# Patient Record
Sex: Female | Born: 1985
Health system: Western US, Academic
[De-identification: ages and names within clinical notes are randomized; demographics above are authoritative.]

## PROBLEM LIST (undated history)

## (undated) DIAGNOSIS — F419 Anxiety disorder, unspecified: Secondary | ICD-10-CM

## (undated) DIAGNOSIS — F4310 Post-traumatic stress disorder, unspecified: Secondary | ICD-10-CM

## (undated) DIAGNOSIS — O009 Unspecified ectopic pregnancy without intrauterine pregnancy: Secondary | ICD-10-CM

## (undated) HISTORY — PX: APPENDECTOMY: SHX54

## (undated) HISTORY — PX: LIPOSUCTION: SHX10

---

## 2005-06-24 ENCOUNTER — Observation Stay (HOSPITAL_COMMUNITY): Admission: RE | Admit: 2005-06-24 | Discharge: 2005-06-25 | Payer: Self-pay | Admitting: Chiropractic Medicine

## 2007-04-24 IMAGING — CT CT ABDOMEN W/ CM
1 series · 15 of 32 positions shown, 19 images · IV contrast (omnipaque)
Comparison: none

CLINICAL DATA: 19-year-old with abdominal pain; question of appendicitis.
 ABDOMEN CT WITH CONTRAST:
TECHNIQUE: Multidetector CT imaging of the abdomen was performed following the standard protocol during bolus administration of intravenous contrast.  The patient returned for delayed views of the pelvis in left lateral decubitus position.
 Contrast:  125 cc Omnipaque 300 and oral contrast.
TECHNIQUE: Multidetector CT imaging of the pelvis was performed following the standard protocol during bolus administration of intravenous contrast.

[Series 2: abd_pel 5.0 b40f st · axial · 0.59mm/px · z∈[-442,-252]mm · 15 of 43 slices shown, 19 images]
[im 3/43  soft-tissue]
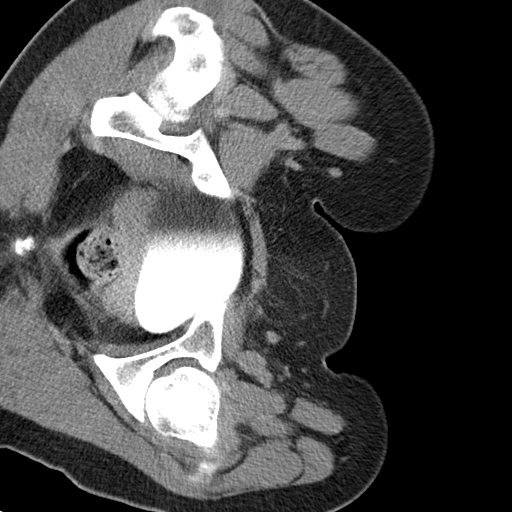
[im 3/43  bone]
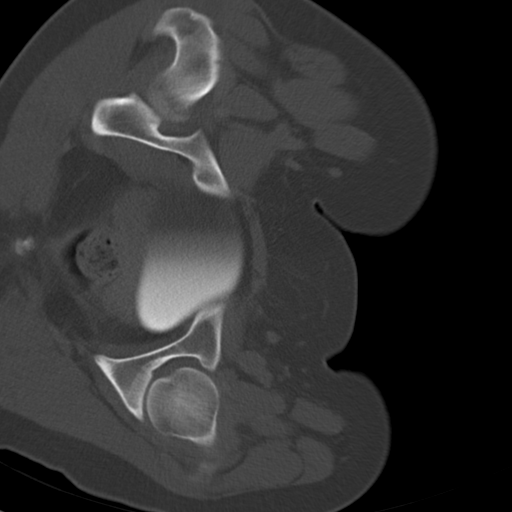
[im 6/43  soft-tissue]
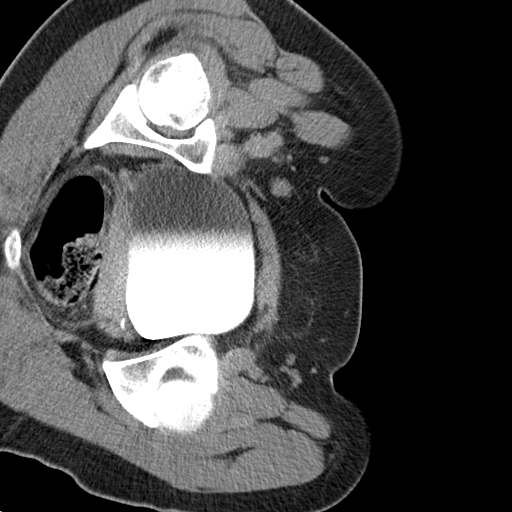
[im 9/43  soft-tissue]
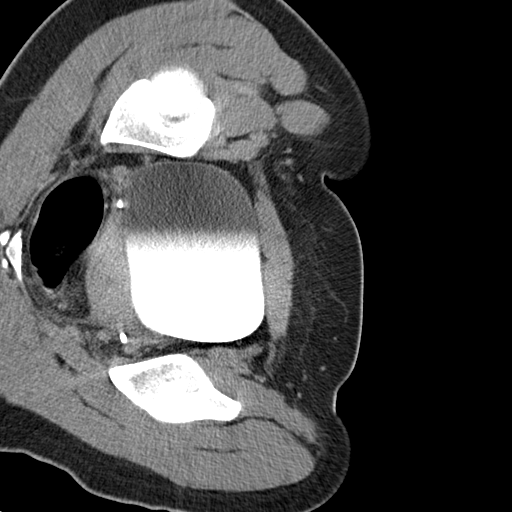
[im 13/43  soft-tissue]
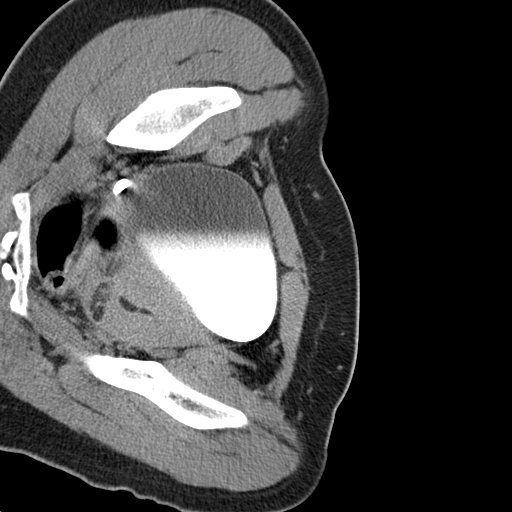
[im 15/43  soft-tissue]
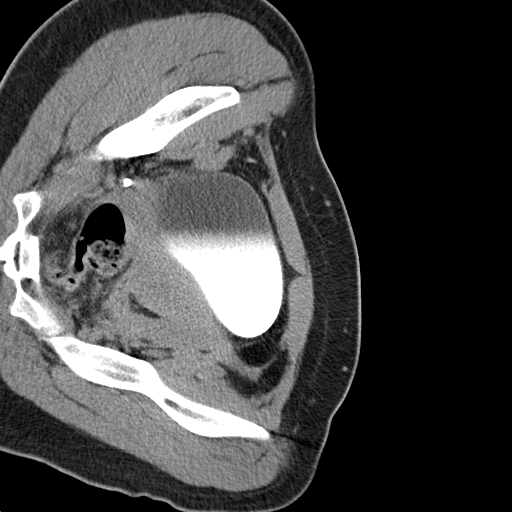
[im 18/43  soft-tissue]
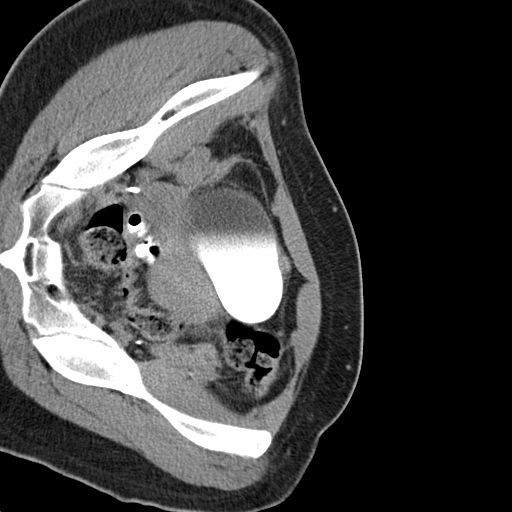
[im 22/43  soft-tissue]
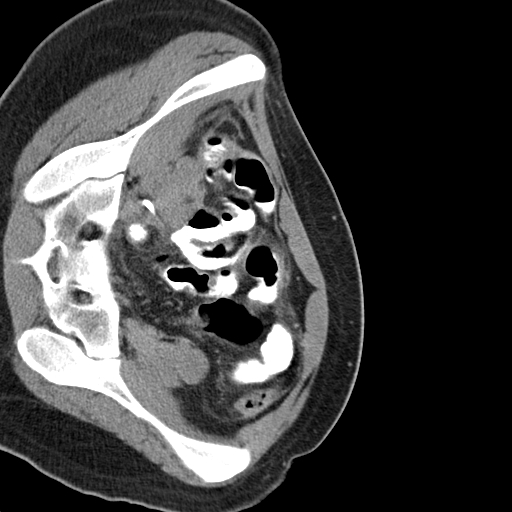
[im 25/43  soft-tissue]
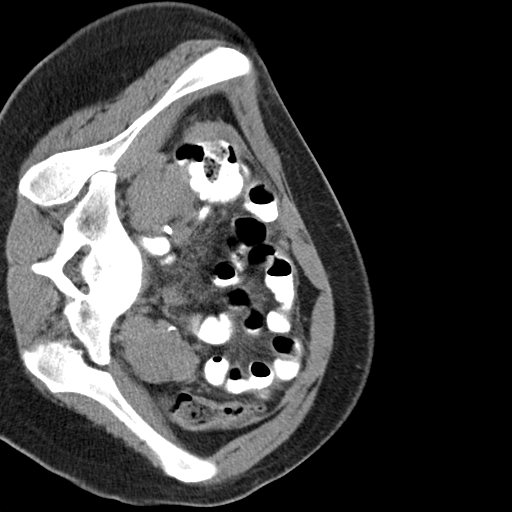
[im 28/43  soft-tissue]
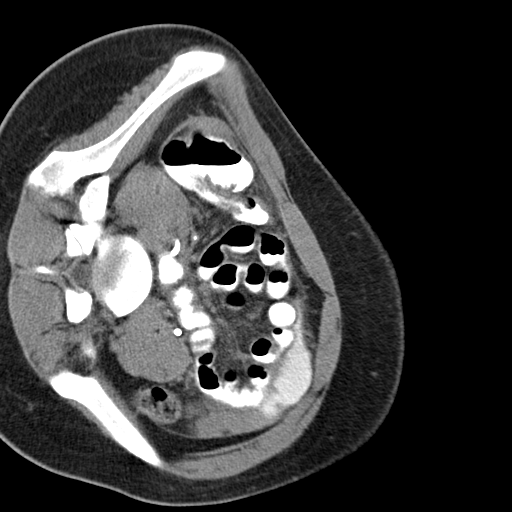
[im 28/43  bone]
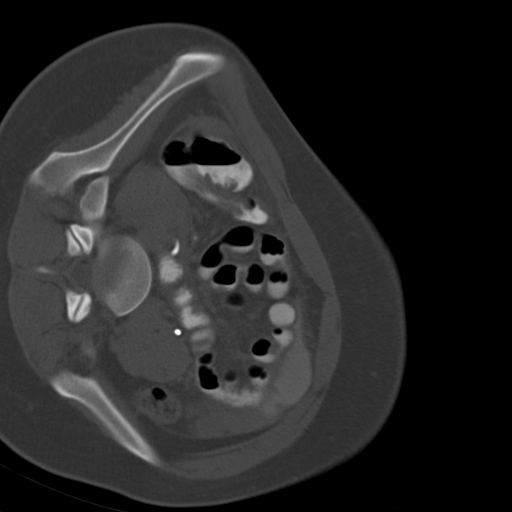
[im 30/43  soft-tissue]
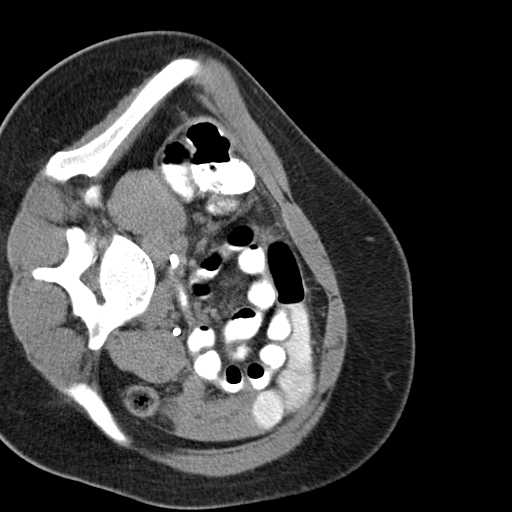
[im 34/43  soft-tissue]
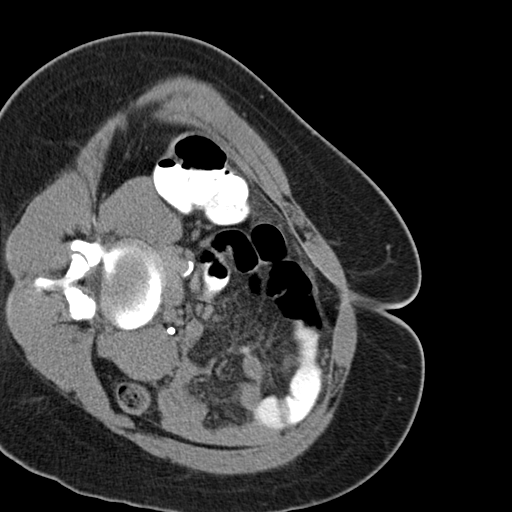
[im 37/43  soft-tissue]
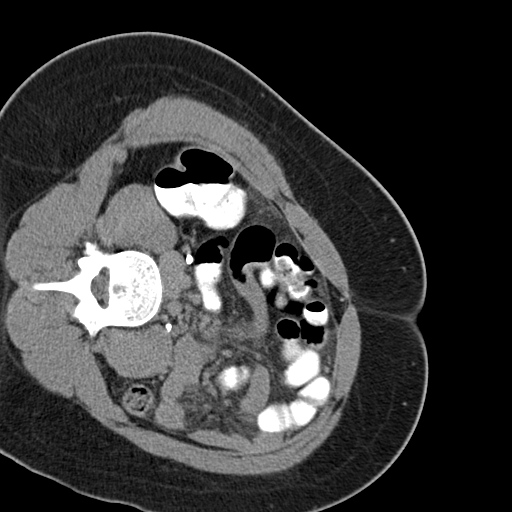
[im 37/43  lung]
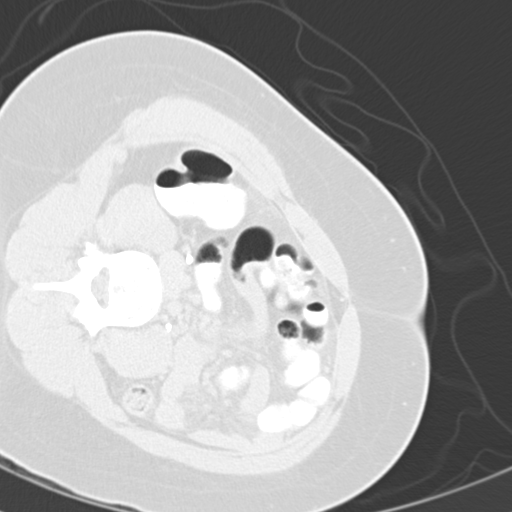
[im 38/43  lung]
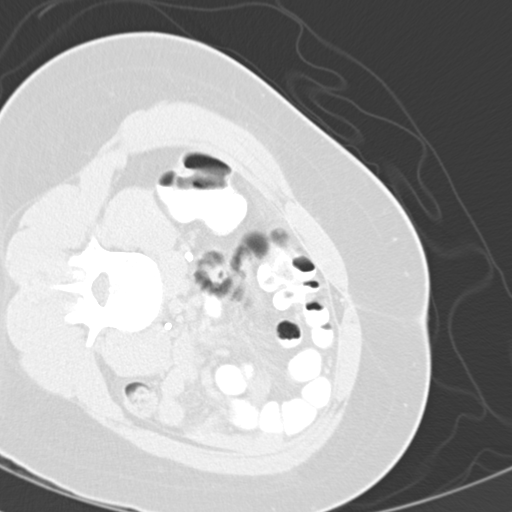
[im 40/43  soft-tissue]
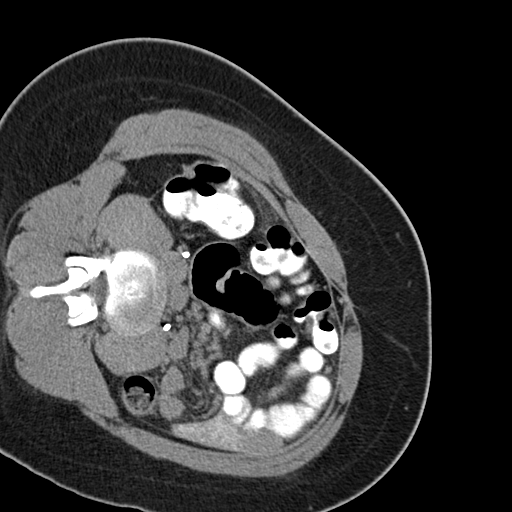
[im 40/43  lung]
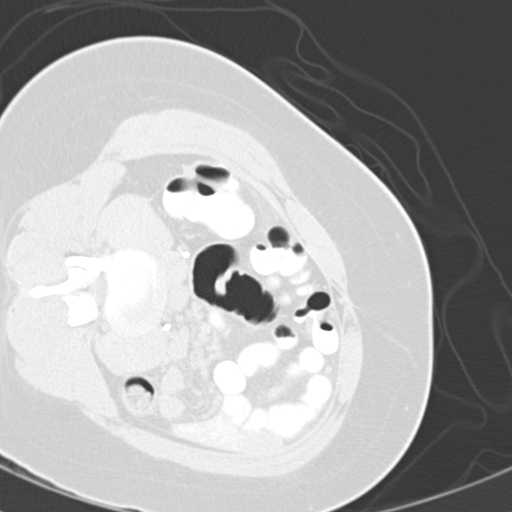
[im 41/43  lung]
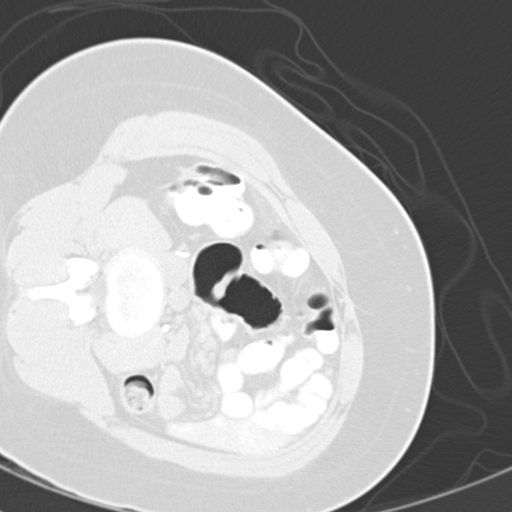

[15 of 32 positions shown; findings below may reference images not displayed]

FINDINGS: Images of the lung bases are unremarkable.  No focal abnormality is seen within the liver, spleen, pancreas, adrenal glands or kidneys.  The gallbladder is present.  There is no retroperitoneal adenopathy.  There is peritoneal thickening on the right, most notable in the right lower quadrant.  Just anterior to the cecum, there is a persistent tubular structure, which does not fill with contrast nor change in position when the patient is on her left side.  I suspect that this represents a dilated, inflamed appendix immediately adjacent to the cecum.  There may also be some secondary inflammatory change of the terminal ileum but on delayed images, this segment of bowel appears to be more smooth.  I favor the findings being related to appendicitis rather than other inflammatory process such as Crohn?s disease based on the location of the changes.
IMPRESSION: 1.  Suspect appendicitis, see above.
 2.  Peritoneal thickening, right lower quadrant.
 PELVIS CT WITH CONTRAST:
FINDINGS: The uterus is present.  There is free pelvic fluid and mild diffuse peritoneal thickening, especially in the right lower quadrant.  As described above, there is inflammatory change adjacent to the cecum, thought to represent a dilated, inflamed appendix measuring 1 cm in diameter.
IMPRESSION: 1.  Suspect appendicitis.
 2.  Free pelvic fluid and mild diffuse peritoneal enhancement.

## 2015-07-14 ENCOUNTER — Encounter (HOSPITAL_COMMUNITY): Payer: Self-pay | Admitting: Emergency Medicine

## 2015-07-14 ENCOUNTER — Emergency Department (HOSPITAL_COMMUNITY)
Admission: EM | Admit: 2015-07-14 | Discharge: 2015-07-14 | Disposition: A | Discharge status: Home / Self Care | Payer: Self-pay | Attending: Emergency Medicine | Admitting: Emergency Medicine

## 2015-07-14 DIAGNOSIS — F419 Anxiety disorder, unspecified: Secondary | ICD-10-CM | POA: Insufficient documentation

## 2015-07-14 DIAGNOSIS — R064 Hyperventilation: Secondary | ICD-10-CM

## 2015-07-14 DIAGNOSIS — F410 Panic disorder [episodic paroxysmal anxiety] without agoraphobia: Secondary | ICD-10-CM

## 2015-07-14 HISTORY — DX: Anxiety disorder, unspecified: F41.9

## 2015-07-14 MED ORDER — LORAZEPAM 2 MG/ML IJ SOLN
1.0000 mg | Freq: Once | INTRAMUSCULAR | Status: AC
  Administered 2015-07-14: 1 mg via INTRAVENOUS

## 2015-07-14 MED ORDER — LORAZEPAM 2 MG/ML IJ SOLN
INTRAMUSCULAR | Status: AC
  Filled 2015-07-14: qty 1

## 2015-07-14 MED ORDER — ALPRAZOLAM 1 MG PO TABS: 1.0000 mg | ORAL_TABLET | Freq: Three times a day (TID) | ORAL | Status: DC | PRN

## 2015-07-14 NOTE — ED Notes (Signed)
Pt resting family at bedside , pt states that she feels better

## 2015-07-14 NOTE — ED Notes (Signed)
Pt here visiting on 2300 , pt was asked to get up out of hallway and moved to waiting area where she started hyperventilating rapid response called and pt brought to ED

## 2015-07-14 NOTE — Discharge Instructions (Signed)
You have been seen today for anxiety and hyperventilation. You may not drive while under the influence of Ativan or other benzodiazepines. Follow up with PCP about this incident. Return to ED should symptoms worsen. Use the included resource guide for community support and resources.   Emergency Department Resource Guide 1) Find a Doctor and Pay Out of Pocket Although you won't have to find out who is covered by your insurance plan, it is a good idea to ask around and get recommendations. You will then need to call the office and see if the doctor you have chosen will accept you as a new patient and what types of options they offer for patients who are self-pay. Some doctors offer discounts or will set up payment plans for their patients who do not have insurance, but you will need to ask so you aren't surprised when you get to your appointment.  2) Contact Your Local Health Department Not all health departments have doctors that can see patients for sick visits, but many do, so it is worth a call to see if yours does. If you don't know where your local health department is, you can check in your phone book. The CDC also has a tool to help you locate your state's health department, and many state websites also have listings of all of their local health departments.  3) Find a Walk-in Clinic If your illness is not likely to be very severe or complicated, you may want to try a walk in clinic. These are popping up all over the country in pharmacies, drugstores, and shopping centers. They're usually staffed by nurse practitioners or physician assistants that have been trained to treat common illnesses and complaints. They're usually fairly quick and inexpensive. However, if you have serious medical issues or chronic medical problems, these are probably not your best option.  No Primary Care Doctor: - Call Health Connect at  [phone] - they can help you locate a primary care doctor that  accepts your  insurance, provides certain services, etc. - Physician Referral Service- [phone]  Chronic Pain Problems: Organization         Address  Phone   Notes  [facility]  [phone] Patients need to be referred by their primary care doctor.   Medication Assistance: Organization         Address  Phone   Notes  [facility] Medication [facility] [address] [place], [place] [id number] [phone] --Must be a resident of [facility] -- Must have NO insurance coverage whatsoever (no Medicaid/ Medicare, etc.) -- The pt. MUST have a primary care doctor that directs their care regularly and follows them in the community   MedAssist  [phone]   [employer]  [phone]    Agencies that provide inexpensive medical care: Organization         Address  Phone   Notes  [facility]  [phone]) [phone]   [facility]    [phone]) [phone]   [facility] [address] [place], [place] [address] [phone]) [phone]   [facility] [address], [place] [phone]   [facility]anned [facility]    [phone]   [facility]    [phone]   Community [facility] and [facility]  [address], [place] Phone:  [phone]) [phone], Fax:  [phone]) [phone] Hours of Operation:  9 am - 6 pm, M-F.  Also accepts Medicaid/Medicare and self-pay.  [facility] for [place], [address], [place] Phone: [phone], Fax: [phone]. Hours of Operation:  8:30 am - 5:30 pm, M-F.  Also accepts Medicaid and self-pay.  [facility] [address], [place] Phone: [phone]   [place], [place], [place] [phone], Ext. [phone] Mondays & Thursdays: 7-9 AM.  First 15 patients are seen on a first come, first serve basis.    [place] Providers:  Organization          Address  Phone   Notes  [facility] [address], [place] [phone] Also accepts self-pay patients.  [facility] [address], [place]  [phone]   [place], [address], [place] [phone]   [facility] [address], [place] [phone]   Lucianne Lei [address], [place]   [phone] Only accepts [place] Access [place] patients after they have their name applied to their card.   Self-Pay (no insurance) in [facility]:  Organization         Address  Phone   Notes  Sickle Cell Patients, [facility] [place] [phone]   [facility] Urgent Care [place] [phone]   [facility] [place]  [place], [place], [place] [phone]   [facility]/Dr. Osei-Bonsu  [address] [place] or [address], [place] [phone] Phone number for both [place] and [place] locations is the same.  Urgent Medical and [facility] [address], [place] [phone]   [facility] [address], [place] or [address] [phone]   [facility] [address] [place] [phone], phone; [phone], fax Sees patients 1st and 3rd Saturday of every month.  Must not qualify for public or private insurance (i.e. Medicaid, Medicare, [place] Health Choice, Veterans' Benefits)  Household income should be no more than 200% of the poverty level The clinic cannot treat you if you are pregnant or think you are pregnant  Sexually transmitted diseases are not treated at the clinic.    Dental Care: Organization         Address  Phone  Notes  [facility] Department of [facility] [place] [phone] Accepts children up to age 40 who are enrolled in [place] or [place]; pregnant women with a Medicaid card; and children who have applied for Medicaid or [place] Health Choice, but were declined, whose parents can pay a reduced fee at time of service.  [facility] Department of [facility]  [address], [place] [phone] Accepts children up to age 10 who are enrolled in [place] or [place]; pregnant women with a Medicaid card; and children who have applied for Medicaid or [place] Health Choice, but were declined, whose parents can pay a reduced fee at time of service.  [place] Adult Dental Access PROGRAM  [place] [phone] Patients are seen by appointment only. Walk-ins are not accepted. [place] will see patients 12 years of age and older. Monday - Tuesday (  8am-5pm) Most Wednesdays (8:30-5pm) $30 per visit, cash only  [facility] Adult Dental Access PROGRAM  [address], [facility] [phone] Patients are seen by appointment only. Walk-ins are not accepted. [place] will see patients 35 years of age and older. One Wednesday Evening (Monthly: Volunteer Based).  $30 per visit, cash only  [place]  [phone] for adults; Children under age 38, call Graduate Pediatric Dentistry at [phone]. Children aged 78-14, please call [phone] to request a pediatric application.  Dental services are provided in all areas of dental care including fillings, crowns and bridges, complete and partial dentures, implants, gum treatment, root canals, and extractions. Preventive care is also provided. Treatment is provided to both adults and children. Patients are selected via a lottery and there is often a waiting list.   [facility] [address] [place]  [phone] [email or web address]   [facility] [address] [place], [place]  [phone], Ext. [phone] Second and Fourth Thursday of each month, opens at 6:30 AM; Clinic ends at 9 AM.  Patients are seen on a first-come first-served basis, and a limited number are seen during each clinic.   [facility]  [address] Hillard Danker [place], [place] [phone]   Eligibility Requirements You must have lived in [place], [place], or [place] counties for at least the last three months.   You cannot be eligible for state or federal sponsored [employer], including [employer], [place], or [employer].   You generally cannot be eligible for healthcare insurance through your employer.    How to apply: Eligibility screenings are held every Tuesday and Wednesday afternoon from 1:00 pm until 4:00 pm. You do not need an appointment for the interview!  [facility] [address] [place], [place]   [place]  [place] Department  [place]  [phone]    Behavioral Health Resources in the Community: Intensive Outpatient Programs Organization         Address  Phone  Notes  [place]. [address] [place], [place] [phone]   [facility] [address] [place], [place]   ADS: Alcohol & Drug Svcs [address], [place], [place]   [place] [address]  [place], [place] or [phone]   Substance Abuse Resources Organization         Address  Phone  Notes  Alcohol and Drug Services  [phone]   [place]  [phone]   The [place]   [place] Pester  [phone]   Residential & Outpatient Substance Abuse Program  [phone]   Psychological Services Organization         Address  Phone  Notes  [facility] [place]  [place]  [phone]    [place] [address], [place] or [phone]    Mobile Crisis Teams Organization         Address  Phone  Notes  [facility], Mobile Crisis Care Unit  [phone]   [facility]ive Psychotherapeutic Services  [address] [place], [place]   [place] [address] [place] [phone]    Self-Help/Support Groups Organization         Address  Phone  Notes  [facility]ental Health Assoc[facility] of [place] - variety of support groups  [place] Call for more information  Narcotics Anonymous (NA), Caring Services [address], [employer] [place]  2 meetings at this location   Special educational needs teacher         Address  Phone  Notes  [facility] Residential Treatment [place],    [place]  [phone]   [facility]  [address], [place] [address]34, [place], [place]   [place], [place] [phone] Admissions: 8am-3pm M-F  Incentives Substance [place] [address],    [place], [place] [phone]   [facility] [address] [place], [place], [place]   [facility] [address]  [place], [place]   Insight Programs - Intensive Outpatient [address], [place], [place]   [facility] ([place].,  [place], [place] [phone] or [phone]   [facility]idential Treatment Services (RTS) [address] [place], [place] Accepts Medicaid  [facility] [address],  [place] [phone] Substance Abuse/Addiction Treatment   [facility] Organization         Address  Phone  Notes  [facility]  [phone]   Domenic Schwab, PhD [address] Arlis Porta [place], [place]   [phone] or [phone]   [place]  [place], [place] [phone]   [facility] [address], [place], [place] [phone] Insurance/Medicaid/sponsorship through [facility] and Families [address], [address] [place]                                    [place], [place] [phone] [place] [address], [place] [phone]    Dr. Adele Schilder  [phone]   [facility] Dept. 1) [address] [place] 2) [place] 3)  [place] [address], [place] [phone]  [phone]   [place] [phone] or [phone] (After Hours)

## 2015-07-14 NOTE — Progress Notes (Signed)
   07/14/15 1200  Clinical Encounter Type  Visited With Patient;Family;Patient and family together;Health care provider  Visit Type Initial;Behavioral Health;Social support;Psychological support;Spiritual support  Referral From Nurse  Spiritual Encounters  Spiritual Needs Emotional  Stress Factors  Patient Stress Factors Exhausted;Loss of control (anxiety)  Ch called to 2S where pt was visiting mother; when [facility] arrived, [facility] observed [facility] team working with pt having anxiety attack, on floor and breathing rapidly; [facility] consulted with RN and briefly met mother and uncle/aunt; [place] then met pt in ED [facility] and indicated to pt about mother and that family was coming to her; pt acknowledged and Hankinson then escorted uncle and aunt to pt bedside.  [place] available as needed. 12:53 PM Gwynn Burly

## 2015-07-14 NOTE — ED Provider Notes (Signed)
CSN: [id number]     Arrival date & time 07/14/15  1133 History   First MD Initiated Contact with Patient 07/14/15 1141     Chief Complaint  Patient presents with  . Hyperventilating     (Consider location/radiation/quality/duration/timing/severity/associated sxs/prior Treatment) HPI   Bianca Pitts is a 30 y.o. female, with a history of anxiety, presenting to the ED with hyperventilation. Patient was in the waiting room for 2300 [facility] while her mother was receiving open-heart surgery. Her mother got out of surgery and apparently she was not allowed to go back and see her, possibly from other family members restricting her access. Patient then started hyperventilating and crying. No falls or trauma reported. The hospital chaplain came down to see the patient here in the emergency room, told her that he just spoke with her mother and that her mother's doing fine. Also states that the patient's aunt and uncle will be down as soon as they can. [place] [facility] and is here visiting her mother. Patient adds that she is a daily smoker of marijuana.   Past Medical History  Diagnosis Date  . Anxiety    No past surgical history on file. No family history on file. Social History  Substance Use Topics  . Smoking status: Not on file  . Smokeless tobacco: Not on file  . Alcohol Use: Not on file   OB History    No data available     Review of Systems  Psychiatric/Behavioral: The patient is nervous/anxious.        Hyperventilation  All other systems reviewed and are negative.     Allergies  Review of patient's allergies indicates no known allergies.  Home Medications   Prior to Admission medications   Medication Sig Start Date End Date Taking? Authorizing Provider  ALPRAZolam Prudy Feeler(XANAX) 1 MG tablet Take 1 tablet (1 mg total) by mouth 3 (three) times daily as needed for anxiety. 07/14/15   Scarlett Portlock C Kayelyn Lemon, PA-C   BP 110/71 mmHg  Pulse 67  Temp(Src) 97.5 F (36.4  C) (Axillary)  Resp 15  SpO2 98% Physical Exam  Constitutional: She is oriented to person, place, and time. She appears well-developed and well-nourished. No distress.  HENT:  Head: Normocephalic and atraumatic.  Eyes: Conjunctivae and EOM are normal. Pupils are equal, round, and reactive to light.  Neck: Normal range of motion. Neck supple.  Cardiovascular: Normal rate, regular rhythm and normal heart sounds.   Pulmonary/Chest: Effort normal and breath sounds normal. No respiratory distress.  Abdominal: Soft. Bowel sounds are normal.  Musculoskeletal: She exhibits no edema or tenderness.  Full ROM in all extremities and spine. No paraspinal tenderness.   Lymphadenopathy:    She has no cervical adenopathy.  Neurological: She is alert and oriented to person, place, and time. She has normal reflexes.  No sensory deficits. Strength 5/5 in all extremities. No gait disturbance. Coordination intact. Cranial nerves III-XII grossly intact. No facial droop.   Skin: Skin is warm and dry. She is not diaphoretic.  Nursing note and vitals reviewed.   ED Course  Procedures (including critical care time) Labs Review Labs Reviewed - No data to display  Imaging Review No results found. I have personally reviewed and evaluated these images and lab results as part of my medical decision-making.   EKG Interpretation None      MDM   Final diagnoses:  Hyperventilation  Anxiety attack    Bianca RadonChandra Fickett presents with hyperventilation, likely due  to an anxiety reaction.  Findings and plan of care discussed with Tyrone Apple, MD.  Patient was assessed with Dr. Cyndie Chime. Patient calmed down after 1 mg of IV Ativan. Her breathing reset and she began to breathe and normal rate. Patient maintained SPO2 98%. Patient was able to carry on a conversation calmly at this point. Patient denies any falls or trauma. Denies any current pain or complaints. Patient then states "there is just a lot of family drama  going on and it makes my anxiety worse." 1:13 PM Patient was reassessed. Patient's breathing is back down to normal. Full neurologic assessment shows no deficits. Patient states that she feels ready to be discharged. Patient confirms that her presentation today is consistent with her previous panic or anxiety attacks. Patient states that although she has a prescription for Xanax she doesn't have with her. Patient was told that she would need to follow up with somebody locally if she is going to be here for any length of time. Patient was given community provider resource guide, information on anxiety and panic attacks, as well as return precautions. Patient voiced understanding of these instructions and is comfortable with discharge.  Filed Vitals:   07/14/15 1215 07/14/15 1230 07/14/15 1300 07/14/15 1330  BP: 104/74 103/76 106/67 110/71  Pulse: 60 60 71 67  Temp:      TempSrc:      Resp: 15 16 14 15   SpO2: 98% 99% 97% 98%     Anselm Pancoast, PA-C 07/14/15 1637  Leta Baptist, MD 07/17/15 1019

## 2018-11-23 ENCOUNTER — Other Ambulatory Visit: Payer: Self-pay

## 2018-11-23 ENCOUNTER — Emergency Department (HOSPITAL_COMMUNITY)
Admission: EM | Admit: 2018-11-23 | Discharge: 2018-11-24 | Disposition: A | Discharge status: Other Acute Inpatient Hospital | Payer: 59 | Attending: Emergency Medicine | Admitting: Emergency Medicine

## 2018-11-23 ENCOUNTER — Encounter (HOSPITAL_COMMUNITY): Payer: Self-pay

## 2018-11-23 DIAGNOSIS — Z1159 Encounter for screening for other viral diseases: Secondary | ICD-10-CM | POA: Diagnosis not present

## 2018-11-23 DIAGNOSIS — F410 Panic disorder [episodic paroxysmal anxiety] without agoraphobia: Secondary | ICD-10-CM | POA: Insufficient documentation

## 2018-11-23 DIAGNOSIS — F329 Major depressive disorder, single episode, unspecified: Secondary | ICD-10-CM | POA: Insufficient documentation

## 2018-11-23 DIAGNOSIS — F4310 Post-traumatic stress disorder, unspecified: Secondary | ICD-10-CM | POA: Diagnosis not present

## 2018-11-23 DIAGNOSIS — F419 Anxiety disorder, unspecified: Secondary | ICD-10-CM | POA: Insufficient documentation

## 2018-11-23 DIAGNOSIS — T424X4A Poisoning by benzodiazepines, undetermined, initial encounter: Secondary | ICD-10-CM | POA: Diagnosis present

## 2018-11-23 DIAGNOSIS — F1290 Cannabis use, unspecified, uncomplicated: Secondary | ICD-10-CM | POA: Insufficient documentation

## 2018-11-23 HISTORY — DX: Unspecified ectopic pregnancy without intrauterine pregnancy: O00.90

## 2018-11-23 HISTORY — DX: Post-traumatic stress disorder, unspecified: F43.10

## 2018-11-23 LAB — CBC WITH DIFFERENTIAL/PLATELET
Abs Immature Granulocytes: 0.05 10*3/uL (ref 0.00–0.07)
Basophils Absolute: 0 10*3/uL (ref 0.0–0.1)
Basophils Relative: 0 %
Eosinophils Absolute: 0.1 10*3/uL (ref 0.0–0.5)
Eosinophils Relative: 1 %
HCT: 43.9 % (ref 36.0–46.0)
Hemoglobin: 14.6 g/dL (ref 12.0–15.0)
Immature Granulocytes: 0 %
Lymphocytes Relative: 30 %
Lymphs Abs: 3.7 10*3/uL (ref 0.7–4.0)
MCH: 31.8 pg (ref 26.0–34.0)
MCHC: 33.3 g/dL (ref 30.0–36.0)
MCV: 95.6 fL (ref 80.0–100.0)
Monocytes Absolute: 1 10*3/uL (ref 0.1–1.0)
Monocytes Relative: 8 %
Neutro Abs: 7.5 10*3/uL (ref 1.7–7.7)
Neutrophils Relative %: 61 %
Platelets: 347 10*3/uL (ref 150–400)
RBC: 4.59 MIL/uL (ref 3.87–5.11)
RDW: 12.2 % (ref 11.5–15.5)
WBC: 12.3 10*3/uL — ABNORMAL HIGH (ref 4.0–10.5)
nRBC: 0 % (ref 0.0–0.2)

## 2018-11-23 LAB — I-STAT BETA HCG BLOOD, ED (MC, WL, AP ONLY): I-stat hCG, quantitative: <5 m[IU]/mL (ref <5)

## 2018-11-23 LAB — COMPREHENSIVE METABOLIC PANEL
ALT: 22 U/L (ref 0–44)
AST: 20 U/L (ref 15–41)
Albumin: 4.1 g/dL (ref 3.5–5.0)
Alkaline Phosphatase: 53 U/L (ref 38–126)
Anion gap: 9 (ref 5–15)
BUN: 14 mg/dL (ref 6–20)
CO2: 24 mmol/L (ref 22–32)
Calcium: 9.6 mg/dL (ref 8.9–10.3)
Chloride: 106 mmol/L (ref 98–111)
Creatinine, Ser: 0.83 mg/dL (ref 0.44–1.00)
GFR calc Af Amer: >60 mL/min (ref >60)
GFR calc non Af Amer: >60 mL/min (ref >60)
Glucose, Bld: 94 mg/dL (ref 70–99)
Potassium: 3.8 mmol/L (ref 3.5–5.1)
Sodium: 139 mmol/L (ref 135–145)
Total Bilirubin: 0.5 mg/dL (ref 0.3–1.2)
Total Protein: 7.2 g/dL (ref 6.5–8.1)

## 2018-11-23 LAB — RAPID URINE DRUG SCREEN, HOSP PERFORMED
Amphetamines: NOT DETECTED
Barbiturates: NOT DETECTED
Benzodiazepines: POSITIVE — AB
Cocaine: NOT DETECTED
Opiates: NOT DETECTED
Tetrahydrocannabinol: POSITIVE — AB

## 2018-11-23 LAB — ACETAMINOPHEN LEVEL
Acetaminophen (Tylenol), Serum: <10 ug/mL — ABNORMAL LOW (ref 10–30)
Acetaminophen (Tylenol), Serum: <10 ug/mL — ABNORMAL LOW (ref 10–30)

## 2018-11-23 LAB — ETHANOL: Alcohol, Ethyl (B): <10 mg/dL (ref <10)

## 2018-11-23 LAB — SALICYLATE LEVEL: Salicylate Lvl: <7 mg/dL (ref 2.8–30.0)

## 2018-11-23 MED ORDER — HYDROXYZINE HCL 25 MG PO TABS: 50.0000 mg | ORAL_TABLET | Freq: Once | ORAL | Status: DC

## 2018-11-23 MED ORDER — SODIUM CHLORIDE 0.9 % IV BOLUS
1000.0000 mL | Freq: Once | INTRAVENOUS | Status: AC
  Administered 2018-11-23: 22:00:00 1000 mL via INTRAVENOUS

## 2018-11-23 NOTE — ED Notes (Signed)
Pt's family contact information placed on sticker on back of pt stickers

## 2018-11-23 NOTE — ED Provider Notes (Signed)
[facility] EM[facility]GENCY [facility]MENT Provider Note   CSN: [id number] Arrival date & time: 11/23/18  1735    History   Chief Complaint Chief Complaint  Patient presents with   Panic Attack    HPI Bianca Pitts is a 33 y.o. female.     Bianca RadonChandra Mccaslin is a 33 y.o. female with a history of anxiety and panic attacks, PTSD, who presents to the emergency department for evaluation of possible benzo overdose.  Patient brought in by her aunt after she reports taking 10 mg of Xanax around noon and an additional 20 mg 30 to 40 minutes prior to arrival.  Patient reports that she took these medications because she was feeling very anxious and panicked and just wanted to be able to calm down and go to sleep.  She reports that she has been under increasing anxiety because her mom is very ill and is about to enter hospice care.  Today was her birthday and all of her extended family was gathered to celebrate with her and she flew in from out of town for this.  She reports that she is very stressed when she is around her family members, took larger doses than usual of her medication before lunch to make herself "as calm as possible" so that she could deal with her family.  But after getting into an argument with her uncle she went out to her car and took with the rest of the Xanax left in the bottle approximately 10 tablets of 2 mg Xanax.  She denies thoughts of harming herself or taking this with the intent to kill herself.  But does understand that this medication has risks when taken at high doses.  She reports a long history of anxiety and panic attacks, she usually self medicates with marijuana and Xanax.  Denies any hallucinations or HI.  No focal medical complaints today, no fevers or recent illness.  Patient's aunt expresses concern of the patient's mental health and wellbeing which is why she brought her in today.  She reports that she is concerned that the patient will try and leave more  quickly to return to family but thinks that she would benefit from continued evaluation of her mental health.  Is concerned for her safety if she were to leave the hospital today.     Past Medical History:  Diagnosis Date   Anxiety    Ectopic pregnancy    PTSD (post-traumatic stress disorder)     There are no active problems to display for this patient.   Past Surgical History:  Procedure Laterality Date   APPENDECTOMY       OB History   No obstetric history on file.      Home Medications    Prior to Admission medications   Medication Sig Start Date End Date Taking? Authorizing Provider  ALPRAZolam Prudy Feeler(XANAX) 1 MG tablet Take 1 tablet (1 mg total) by mouth 3 (three) times daily as needed for anxiety. 07/14/15   Joy, Shawn C, PA-C  amoxicillin-clavulanate (AUGMENTIN) 875-125 MG tablet Take 1 tablet by mouth 2 (two) times daily. 07/27/18   [provider]  fluconazole (DIFLUCAN) 150 MG tablet TAKE ONE TABLET BY MOUTH EVERY 3 DAYS 07/27/18   [provider]  ofloxacin (OCUFLOX) 0.3 % ophthalmic solution INSTILL 1 - 2 DROPS INTO BOTH EYES 3 TIMES A DAY FOR 7 DAYS 07/27/18   [provider]    Family History No family history on file.  Social History Social  History   Tobacco Use   Smoking status: Never Smoker  Substance Use Topics   Alcohol use: Not on file   Drug use: Yes    Types: Marijuana     Allergies   Patient has no known allergies.   Review of Systems Review of Systems  Constitutional: Negative for chills and fever.  HENT: Negative.   Eyes: Negative for visual disturbance.  Respiratory: Negative for cough and shortness of breath.   Cardiovascular: Negative for chest pain.  Gastrointestinal: Negative for abdominal pain, nausea and vomiting.  Genitourinary: Negative for dysuria.  Musculoskeletal: Negative for arthralgias and myalgias.  Skin: Negative for color change and rash.  Neurological: Negative for weakness, numbness  and headaches.  Psychiatric/Behavioral: The patient is nervous/anxious.      Physical Exam Updated Vital Signs BP (!) 136/93 (BP Location: Right Arm)    Pulse 84    Temp 98.7 F (37.1 C) (Oral)    Resp 13    SpO2 97%   Physical Exam Vitals signs and nursing note reviewed.  Constitutional:      General: She is not in acute distress.    Appearance: She is well-developed. She is not diaphoretic.     Comments: Tearful and anxious but in no acute distress  HENT:     Head: Normocephalic and atraumatic.  Eyes:     General:        Right eye: No discharge.        Left eye: No discharge.     Pupils: Pupils are equal, round, and reactive to light.  Neck:     Musculoskeletal: Neck supple.  Cardiovascular:     Rate and Rhythm: Normal rate and regular rhythm.     Heart sounds: Normal heart sounds. No murmur. No friction rub. No gallop.   Pulmonary:     Effort: Pulmonary effort is normal. No respiratory distress.     Breath sounds: Normal breath sounds. No wheezing or rales.     Comments: Respirations equal and unlabored, patient able to speak in full sentences, lungs clear to auscultation bilaterally Abdominal:     General: Bowel sounds are normal. There is no distension.     Palpations: Abdomen is soft. There is no mass.     Tenderness: There is no abdominal tenderness. There is no guarding.  Musculoskeletal:        General: No deformity.  Skin:    General: Skin is warm and dry.     Capillary Refill: Capillary refill takes less than 2 seconds.  Neurological:     Mental Status: She is alert and oriented to person, place, and time.     Coordination: Coordination normal.     Comments: Speech is clear, able to follow commands CN III-XII intact Normal strength in upper and lower extremities bilaterally including dorsiflexion and plantar flexion, strong and equal grip strength Sensation normal to light and sharp touch Moves extremities without ataxia, coordination intact   Psychiatric:         Mood and Affect: Mood is anxious. Affect is tearful.        Speech: Speech normal.        Behavior: Behavior is withdrawn. Behavior is cooperative.        Thought Content: Thought content normal. Thought content does not include homicidal or suicidal ideation.        Judgment: Judgment is impulsive.      ED Treatments / Results  Labs (all labs ordered are listed, but only abnormal  results are displayed) Labs Reviewed - No data to display  EKG None  Radiology No results found.  Procedures Procedures (including critical care time)  Medications Ordered in ED Medications  hydrOXYzine (ATARAX/VISTARIL) tablet 25 mg (has no administration in time range)  sodium chloride 0.9 % bolus 1,000 mL (0 mLs Intravenous Stopped 11/23/18 2331)     Initial Impression / Assessment and Plan / ED Course  I have reviewed the triage vital signs and the nursing notes.  Pertinent labs & imaging results that were available during my care of the patient were reviewed by me and considered in my medical decision making (see chart for details).  Patient presents after she took approximately 30 mg of Xanax over the past 5 hours for her anxiety and panic attacks.  Her mom is ill and about to enter hospice care she was gathered with her family today to celebrate her mom's birthday got into an argument with family and became increasingly anxious took initially 10 mg of Xanax and then later took an additional 20.  She denies wanting to harm herself reporting she just wanted to calm down and go to sleep in her car.  The patient's aunt expresses concern that she may have had underlying desire to harm herself and she is concerned about her mental health and wellbeing.  The patient was planning to fly back to [place] in the morning but the aunt is concerned about her safety and support system.  Wheezing control was consulted and they recommended 6-hour observation and checking Tylenol level with repeat after 4  hours, EKG and BMP.  Medical screening labs were collected and TTS consult was placed.  Patient is not requiring any oxygen or supportive care, she is alert and oriented with normal neuro exam.  Complaining of some dizziness when she is up walking around.  Labs overall reassuring, slight leukocytosis of 12.3 but no fevers or focal infectious symptoms, normal hemoglobin, no acute electrolyte derangements, normal renal and liver function, negative ethanol, acetaminophen and salicylate levels.  UDS is positive for benzodiazepines and THC.  Negative pregnancy.  Cova testing is negative.  TTS is seen and evaluated patient and they recommend inpatient treatment and have bed available here at [facility] hospital.  Patient initially does not want to be admitted, I have discussed at length with the patient's aunt who feels that the patient likely needs further behavioral health care and is concerned about her safety if she were to be discharged tonight, expresses concerns about underlying suicidal intentions.  Given these concerns I feel that it would be prudent for the patient to be admitted for inpatient psychiatric care, she remains reticent about this and asks multiple questions about what would happen if she were to sneak out or leave.  Patient is showing poor insight and concern for her safety, she was IVC for inpatient psychiatric hospitalization due to benzodiazepine overdose with concern for continued anxiety and panic attacks and concern for patient's personal safety.  4-hour Tylenol level was negative as well.  Patient is medically cleared for psychiatric hospitalization.  Patient requesting medication for anxiety, will give dose of hydroxyzine, would like to avoid additional doses of benzodiazepines given large doses earlier today to avoid further CNS depression.  Throughout ED stay patient has remained stable, not requiring any oxygen or further supportive measures, she was given IV fluids.  She is now  ambulatory with steady gait.    Final Clinical Impressions(s) / ED Diagnoses   Final diagnoses:  Benzodiazepine overdose  of undetermined intent, initial encounter  Panic attack  Anxiety    ED Discharge Orders    None       Dartha Lodge, [place] 11/24/18 6962    Alvira Monday, MD 11/26/18 0010

## 2018-11-23 NOTE — ED Notes (Addendum)
Pt took 10-15 1 mg of xanax approximately 30-45 minutes ago. Pt does not take any other medications. Poison control contacted. Concern for CNS depression, would not recommend romazicon. Pt needs BMP, 4 hour post-ingestion acetaminophen level, and EKG. Observation reccomendation of 6 hours.

## 2018-11-23 NOTE — BH Assessment (Signed)
Pt's aunt, Dr. Cardell Peach, called TTS. She says she believes Pt had "underlying intent" to kill herself by taking 10 Xanax at one time. She says Pt's family is not creating "a hostile environment." She says she is going to discuss Pt's case with Dr. Dalene Seltzer "before I make a decision." Explained that TTS is recommending inpatient psychiatric treatment.   Pamalee Leyden, [facility], [facility], [facility] Triage Specialist [phone]

## 2018-11-23 NOTE — BH Assessment (Signed)
Jodi Geralds, PA-C said Pt is being petitioned for IVC. Pt accepted to [facility], room 301-1.   Pamalee Leyden, [facility], [facility], [facility] Triage Specialist [phone]

## 2018-11-23 NOTE — ED Triage Notes (Signed)
Pt brought in by her aunt for possible ingestion. Pt states she is visiting from [place] to take care of her mom who is about to go in to hospice. Pt endorses hx of anxiety and panic attacks. Pt states she took 5 bars of xanax around noon, and 10 bars x1 hour ago. Pt denies SI/HI, states she "just wanted to calm down". Pt calm and cooperative during triage, pt tearful.
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
## 2018-11-24 ENCOUNTER — Inpatient Hospital Stay (HOSPITAL_COMMUNITY)
Admission: AD | Admit: 2018-11-24 | Discharge: 2018-11-25 | DRG: 881 | Disposition: A | Discharge status: Home / Self Care | Payer: 59 | Attending: Psychiatry | Admitting: Psychiatry

## 2018-11-24 ENCOUNTER — Encounter (HOSPITAL_COMMUNITY): Payer: Self-pay

## 2018-11-24 ENCOUNTER — Other Ambulatory Visit: Payer: Self-pay

## 2018-11-24 DIAGNOSIS — F410 Panic disorder [episodic paroxysmal anxiety] without agoraphobia: Secondary | ICD-10-CM | POA: Diagnosis present

## 2018-11-24 DIAGNOSIS — D509 Iron deficiency anemia, unspecified: Secondary | ICD-10-CM | POA: Diagnosis present

## 2018-11-24 DIAGNOSIS — F329 Major depressive disorder, single episode, unspecified: Principal | ICD-10-CM | POA: Diagnosis present

## 2018-11-24 DIAGNOSIS — F1310 Sedative, hypnotic or anxiolytic abuse, uncomplicated: Secondary | ICD-10-CM

## 2018-11-24 DIAGNOSIS — T424X4A Poisoning by benzodiazepines, undetermined, initial encounter: Secondary | ICD-10-CM | POA: Diagnosis not present

## 2018-11-24 DIAGNOSIS — F4310 Post-traumatic stress disorder, unspecified: Secondary | ICD-10-CM | POA: Diagnosis present

## 2018-11-24 DIAGNOSIS — F322 Major depressive disorder, single episode, severe without psychotic features: Secondary | ICD-10-CM | POA: Diagnosis not present

## 2018-11-24 DIAGNOSIS — T50904A Poisoning by unspecified drugs, medicaments and biological substances, undetermined, initial encounter: Secondary | ICD-10-CM | POA: Diagnosis not present

## 2018-11-24 DIAGNOSIS — T424X1A Poisoning by benzodiazepines, accidental (unintentional), initial encounter: Secondary | ICD-10-CM | POA: Diagnosis present

## 2018-11-24 LAB — SARS CORONAVIRUS 2 BY RT PCR (HOSPITAL ORDER, PERFORMED IN ~~LOC~~ HOSPITAL LAB): SARS Coronavirus 2: NEGATIVE

## 2018-11-24 MED ORDER — MAGNESIUM HYDROXIDE 400 MG/5ML PO SUSP: 30.0000 mL | Freq: Every day | ORAL | Status: DC | PRN

## 2018-11-24 MED ORDER — ADULT MULTIVITAMIN W/MINERALS CH
1.0000 | ORAL_TABLET | Freq: Every day | ORAL | Status: DC
  Administered 2018-11-24 – 2018-11-25 (×2): 1 via ORAL
  Filled 2018-11-24 (×3): qty 1

## 2018-11-24 MED ORDER — HYDROXYZINE HCL 25 MG PO TABS
25.0000 mg | ORAL_TABLET | Freq: Four times a day (QID) | ORAL | Status: DC | PRN
  Administered 2018-11-24 – 2018-11-25 (×3): 25 mg via ORAL
  Filled 2018-11-24 (×3): qty 1

## 2018-11-24 MED ORDER — VITAMIN B-1 100 MG PO TABS
100.0000 mg | ORAL_TABLET | Freq: Every day | ORAL | Status: DC
  Administered 2018-11-25: 100 mg via ORAL
  Filled 2018-11-24 (×2): qty 1

## 2018-11-24 MED ORDER — ALUM & MAG HYDROXIDE-SIMETH 200-200-20 MG/5ML PO SUSP: 30.0000 mL | ORAL | Status: DC | PRN

## 2018-11-24 MED ORDER — LOPERAMIDE HCL 2 MG PO CAPS: 2.0000 mg | ORAL_CAPSULE | ORAL | Status: DC | PRN

## 2018-11-24 MED ORDER — LORAZEPAM 1 MG PO TABS
1.0000 mg | ORAL_TABLET | Freq: Once | ORAL | Status: AC
  Administered 2018-11-24: 1 mg via ORAL
  Filled 2018-11-24: qty 1

## 2018-11-24 MED ORDER — GABAPENTIN 600 MG PO TABS: 300.0000 mg | ORAL_TABLET | Freq: Three times a day (TID) | ORAL | Status: DC

## 2018-11-24 MED ORDER — GABAPENTIN 600 MG PO TABS
300.0000 mg | ORAL_TABLET | Freq: Three times a day (TID) | ORAL | Status: DC
  Administered 2018-11-24: 300 mg via ORAL
  Filled 2018-11-24 (×2): qty 0.5

## 2018-11-24 MED ORDER — ACETAMINOPHEN 325 MG PO TABS: 650.0000 mg | ORAL_TABLET | Freq: Four times a day (QID) | ORAL | Status: DC | PRN

## 2018-11-24 MED ORDER — CITALOPRAM HYDROBROMIDE 10 MG PO TABS
10.0000 mg | ORAL_TABLET | Freq: Every day | ORAL | Status: DC
  Administered 2018-11-24 – 2018-11-25 (×2): 10 mg via ORAL
  Filled 2018-11-24 (×3): qty 1

## 2018-11-24 MED ORDER — LORAZEPAM 1 MG PO TABS
1.0000 mg | ORAL_TABLET | Freq: Four times a day (QID) | ORAL | Status: DC | PRN
  Administered 2018-11-24: 1 mg via ORAL
  Filled 2018-11-24: qty 1

## 2018-11-24 MED ORDER — HYDROXYZINE HCL 25 MG PO TABS
25.0000 mg | ORAL_TABLET | Freq: Three times a day (TID) | ORAL | Status: DC | PRN
  Administered 2018-11-24: 25 mg via ORAL
  Filled 2018-11-24: qty 1

## 2018-11-24 MED ORDER — ONDANSETRON 4 MG PO TBDP: 4.0000 mg | ORAL_TABLET | Freq: Four times a day (QID) | ORAL | Status: DC | PRN

## 2018-11-24 MED ORDER — HYDROXYZINE HCL 25 MG PO TABS
25.0000 mg | ORAL_TABLET | Freq: Once | ORAL | Status: AC
  Administered 2018-11-24: 01:00:00 25 mg via ORAL
  Filled 2018-11-24: qty 1

## 2018-11-24 MED ORDER — TRAZODONE HCL 50 MG PO TABS
50.0000 mg | ORAL_TABLET | Freq: Every evening | ORAL | Status: DC | PRN
  Administered 2018-11-24: 50 mg via ORAL
  Filled 2018-11-24: qty 1

## 2018-11-24 MED ORDER — GABAPENTIN 300 MG PO CAPS
300.0000 mg | ORAL_CAPSULE | Freq: Three times a day (TID) | ORAL | Status: DC
  Filled 2018-11-24 (×6): qty 1

## 2018-11-24 NOTE — Progress Notes (Signed)
Patient observed sitting on the floor in her room - per staff, pt fell while trying to put on scrub pants- pt reports that she "tripped" while attempting to put her leg in pants- pt reports that she became dizzy after she fell, not before. Pt reports that she didn't hit her head, but fell on he her right hip- no injuries noted- VS taken- BP 99/55- HR 74- O2 99- Pt provided with pitcher of Gatorade and encouraged to drink. Will continue to monitor -

## 2018-11-24 NOTE — Progress Notes (Signed)
[place] NOVEL CORONAVIRUS (COVID-19) DAILY CHECK-OFF SYMPTOMS - answer yes or no to each - every day NO YES  Have you had a fever in the past 24 hours?  . Fever (Temp > 37.80C / 100F) X   Have you had any of these symptoms in the past 24 hours? . New Cough .  Sore Throat  .  Shortness of Breath .  Difficulty Breathing .  Unexplained Body Aches   X   Have you had any one of these symptoms in the past 24 hours not related to allergies?   . Runny Nose .  Nasal Congestion .  Sneezing   X   If you have had runny nose, nasal congestion, sneezing in the past 24 hours, has it worsened?  X   EXPOSURES - check yes or no X   Have you traveled outside the state in the past 14 days?  X   Have you been in contact with someone with a confirmed diagnosis of COVID-19 or PUI in the past 14 days without wearing appropriate PPE?  X   Have you been living in the same home as a person with confirmed diagnosis of COVID-19 or a PUI (household contact)?    X   Have you been diagnosed with COVID-19?    X              What to do next: Answered NO to all: Answered YES to anything:   Proceed with unit schedule Follow the [facility] Inpatient Flowsheet.   

## 2018-11-24 NOTE — Progress Notes (Signed)
[place] NOVEL CORONAVIRUS (COVID-19) DAILY CHECK-OFF SYMPTOMS - answer yes or no to each - every day NO YES  Have you had a fever in the past 24 hours?  . Fever (Temp > 37.80C / 100F) X   Have you had any of these symptoms in the past 24 hours? . New Cough .  Sore Throat  .  Shortness of Breath .  Difficulty Breathing .  Unexplained Body Aches   X   Have you had any one of these symptoms in the past 24 hours not related to allergies?   . Runny Nose .  Nasal Congestion .  Sneezing   X   If you have had runny nose, nasal congestion, sneezing in the past 24 hours, has it worsened?  X   EXPOSURES - check yes or no X   Have you traveled outside the state in the past 14 days?  X   Have you been in contact with someone with a confirmed diagnosis of COVID-19 or PUI in the past 14 days without wearing appropriate PPE?  X   Have you been living in the same home as a person with confirmed diagnosis of COVID-19 or a PUI (household contact)?    X   Have you been diagnosed with COVID-19?    X              What to do next: Answered NO to all: Answered YES to anything:   Proceed with unit schedule Follow the [facility] Inpatient Flowsheet.   

## 2018-11-24 NOTE — Tx Team (Addendum)
Initial Treatment Plan 11/24/2018 3:52 AM Bianca Pitts GHW:[id number]    PATIENT STRESSORS: Loss of pt reports mother will be in hospice next week Substance abuse   PATIENT STRENGTHS: Ability for insight Active sense of humor Average or above average intelligence Capable of independent living Work skills   PATIENT IDENTIFIED PROBLEMS: Anxiety   Xanax abuse  Panic attacks      " Ways to manage anxiety and not project my feelings on to others. "           DISCHARGE CRITERIA:  Improved stabilization in mood, thinking, and/or behavior  PRELIMINARY DISCHARGE PLAN: Return to previous living arrangement  PATIENT/FAMILY INVOLVEMENT: This treatment plan has been presented to and reviewed with the patient, Bianca Pitts, and/or family member.  The patient and family have been given the opportunity to ask questions and make suggestions.  Floyce Stakes, RN 11/24/2018, 3:52 AM

## 2018-11-24 NOTE — ED Notes (Signed)
Patient had anxiety attack at EMS bridge prior to exiting ED; pt was unable to calm down and was taken back to [facility] to [facility] down; pt states she wants to leave and to talk to her Aunt who told her she could get IVC reversed; RN and sitter was able to calm patient down enough to be transported to [facility]; EDP was able to lay eyes on patient to endure patient was medically ok to be transported-Monique,RN
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
## 2018-11-24 NOTE — Progress Notes (Addendum)
Pt calmer at this time- pt states, "I am prone to panic attacks"-  reports that she needs to get home [facility]) because she is having to pay on her rental car and for her TEFL teacher. Pt currently denies SI/HI and A/V hallucinations. Pt denies that she tried to commit suicide- had taken "[place] xanax" which patient claims is "much weaker". Patient resting in bed at this time Pt remains safe with 15 min checks
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
## 2018-11-24 NOTE — [facility] Group Notes (Signed)
[facility] LCSW Group Therapy Note  11/24/2018  10:00-11:00AM  Type of Therapy and Topic:  Group Therapy:  Adding Supports Including Yourself  Participation Level:  Did Not Attend   Description of Group:  Patients in this group were introduced to the concept that additional supports including self-support are an essential part of recovery.  Patients listed what supports they believe they need to add to their lives to achieve their goals at discharge, and they listed such things as therapist, family, doctor, support groups, and service dog.  CSW described the  continuum of mental health/substance abuse services available and the group discussed the differences among these including support group, therapy group, 12-step group, Assertive Community Treatment Team, Peer Support Specialist, and such.  A song entitled "My Own Hero" was played and a group discussion ensued in which patients stated they could relate to the song and it inspired them to realize they have be willing to help themselves in order to succeed, because other people cannot achieve sobriety or stability for them.  A song was played called "I Am Enough" which led to a discussion about being willing to believe we are worth the effort of being a self-support.  Group members expressed appreciation for each other.  Therapeutic Goals: 1)  demonstrate the importance of being a key part of one's own support system 2)  discuss various available supports 3)  encourage patient to use music as part of their self-support and focus on goals 4)  elicit ideas from patients about supports that need to be added   Summary of Patient Progress:  N/A   Therapeutic Modalities:   Motivational Interviewing Activity  Conroy Goracke J Grossman-Orr       
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
## 2018-11-24 NOTE — [facility] Suicide Risk Assessment (Addendum)
[facility] Admission Suicide Risk Assessment   Nursing information obtained from:  Patient Demographic factors:  Living alone Current Mental Status:  NA Loss Factors:  Loss of significant relationship(pts mom to hospice this week) Historical Factors:  NA Risk Reduction Factors:  Employed  Total Time spent with patient: 45 minutes Principal Problem:  Panic Disorder, BZD Abuse, S/P BZD Overdose  Diagnosis:  Active Problems:   MDD (major depressive disorder), severe (HCC)  Subjective Data:   Continued Clinical Symptoms:  Alcohol Use Disorder Identification Test Final Score (AUDIT): 0 The "Alcohol Use Disorders Identification Test", Guidelines for Use in Primary Care, Second Edition.  World Science writer [facility] [place]). Score between 0-7:  no or low risk or alcohol related problems. Score between 8-15:  moderate risk of alcohol related problems. Score between 16-19:  high risk of alcohol related problems. Score 20 or above:  warrants further diagnostic evaluation for alcohol dependence and treatment.   CLINICAL FACTORS:  33 year old female, lives in [place], reports she is in the [place] area visiting her mother for her 60th birthday.  Reports mother is terminally ill related to chronic cardiac illness.  Patient reports history of anxiety and describes long history of panic attacks and some agoraphobia.  She also endorses history of PTSD symptoms.  She reports she has been using Xanax intermittently for anxiety for several years (which she prefers on the street/not prescribed).  States that she generally does not use Xanax daily  but that use has become more frequent lately due to her anxiety related to her mother.  On day of admission she overdosed on 15 tablets of Xanax, states this was not suicidal in intent but rather related to increased stress and family tension.    Psychiatric Specialty Exam: Physical Exam  ROS  Blood pressure 112/75, pulse 96, temperature 99.4 F (37.4 C),  temperature source Oral, resp. rate 18, height 5\' 4"  (1.626 m), weight 77.1 kg.Body mass index is 29.18 kg/m.   see admit note MSE    COGNITIVE FEATURES THAT CONTRIBUTE TO RISK:  Closed-mindedness and Loss of executive function    SUICIDE RISK:   Moderate:  Frequent suicidal ideation with limited intensity, and duration, some specificity in terms of plans, no associated intent, good self-control, limited dysphoria/symptomatology, some risk factors present, and identifiable protective factors, including available and accessible social support.  PLAN OF CARE: Patient will be admitted to inpatient psychiatric unit for stabilization and safety. Will provide and encourage milieu participation. Provide medication management and maked adjustments as needed.Will also provide medication management to address possible BZD withdrawal. Will follow daily.    I certify that inpatient services furnished can reasonably be expected to improve the patient's condition.   Craige Cotta, MD 11/24/2018, 11:45 AM

## 2018-11-24 NOTE — Progress Notes (Signed)
D. Pt visibly upset after speaking on the phone this morning- pt tore up her paper with phone numbers on it- and stomped off to her room- pt then observed sitting on the floor, crying and screaming (while muffling her screams with her arm). Pt was not consolable for this nurse- MD and NP made aware. A. Pt medicated with Ativan and Gabapentin per MD order. Pt supported emotionally and encouraged to express concerns and ask questions.   R. Pt remains safe with 15 minute checks. Will continue POC.

## 2018-11-24 NOTE — Progress Notes (Signed)
Pt said she will lie and say her over all day was a 10. The one positive thing that happen to her today medication given.

## 2018-11-24 NOTE — ED Notes (Signed)
Patient's belongings inventoried and placed in locker 6; Patent's cell phone remain at RN station due to impending transfer to [facility]

## 2018-11-24 NOTE — ED Notes (Signed)
Patient in room stating she can breath but has her hands covering her mouth; Pt is hyperventilating but remains stable-Monique,RN

## 2018-11-24 NOTE — ED Notes (Addendum)
IVC papers has been served and faxed to [facility], orignal in red folder and copy placed in medical records; patient will be transported to [facility] via [facility] at this time-Monique,RN

## 2018-11-24 NOTE — Progress Notes (Signed)
Patient admitted involuntarily after she and family members had a disagreement about her xanax use. Patient reports that she lives in [place] and came here for her mothers 60th birthday party. Her mother is dying. She reports that she takes xanax and since she was unable to smoke THC she bought some xanax off the street and took 5. She felt they were not strong enough and took more. Her family was afraid she was not going to wake up and had her come to [facility]. Her aunt had her involuntarily committed because she felt this was a suicide attempt. Patient is upset because she has a flight back to [place] in the am and has a rental car to return. She reports that she will need to get back home as soon as possible. She was oriented to unit, smacks given and safety maintained on unit with 15 min checks.

## 2018-11-25 DIAGNOSIS — F322 Major depressive disorder, single episode, severe without psychotic features: Secondary | ICD-10-CM

## 2018-11-25 LAB — TSH: TSH: 1.45 u[IU]/mL (ref 0.350–4.500)

## 2018-11-25 NOTE — Plan of Care (Signed)
  Problem: Education: Goal: Ability to state activities that reduce stress will improve Outcome: Adequate for Discharge   Problem: Coping: Goal: Ability to identify and develop effective coping behavior will improve Outcome: Adequate for Discharge   Problem: Self-Concept: Goal: Ability to identify factors that promote anxiety will improve Outcome: Adequate for Discharge Goal: Level of anxiety will decrease Outcome: Adequate for Discharge Goal: Ability to modify response to factors that promote anxiety will improve Outcome: Adequate for Discharge   Problem: Education: Goal: Knowledge of [place] Education information/materials will improve Outcome: Adequate for Discharge Goal: Emotional status will improve Outcome: Adequate for Discharge Goal: Mental status will improve Outcome: Adequate for Discharge Goal: Verbalization of understanding the information provided will improve Outcome: Adequate for Discharge   Problem: Activity: Goal: Interest or engagement in activities will improve Outcome: Adequate for Discharge Goal: Sleeping patterns will improve Outcome: Adequate for Discharge   Problem: Coping: Goal: Ability to verbalize frustrations and anger appropriately will improve Outcome: Adequate for Discharge Goal: Ability to demonstrate self-control will improve Outcome: Adequate for Discharge   Problem: Health Behavior/Discharge Planning: Goal: Identification of resources available to assist in meeting health care needs will improve Outcome: Adequate for Discharge Goal: Compliance with treatment plan for underlying cause of condition will improve Outcome: Adequate for Discharge   Problem: Physical Regulation: Goal: Ability to maintain clinical measurements within normal limits will improve Outcome: Adequate for Discharge   Problem: Safety: Goal: Periods of time without injury will increase Outcome: Adequate for Discharge

## 2018-11-25 NOTE — Discharge Summary (Signed)
Physician Discharge Summary Note  Patient:  Bianca Pitts is an 33 y.o., female MRN:  [id number] DOB:  08-15-1985 Patient phone:  [phone] (home)  Patient address:   [address] [place] [id number],  Total Time spent with patient: 45 minutes  Date of Admission:  11/24/2018 Date of Discharge: 11/25/2018  Reason for Admission:   Diagnosis:  S/P Alprazolam Overdose, history of Panic Disorder  History of Present Illness: 33 year old single female. Patient presented to ED yesterday, brought by family members, following Xanax overdose. Reports she had taken 5 tablets of Xanax ( 1 mgr) initially, in the context of increased anxiety and family tension ( argument with an uncle), and later took another 10 tablets. She denies suicidal intention and states " I was just trying to relax and be able to return to my mother's birthday".  Patient explains she lives in [place], but has been in [place] this last week with the purpose of visiting her mother who is terminally ill from a cardiac condition and whose 60th birthday was yesterday. States that she has a history of anxiety and panic attacks and that these have worsened recently due to her concerns about her mother. She reports she has been purchasing Xanax ( not prescribed ) for several years, and usually takes irregularly for panic attacks but that more recently she has been taking more frequently and in increased amounts , in the context of increased stress. She does not endorse significant depression or significant neuro-vegetative symptoms of depression. She denies any recent suicidal ideations.  Principal Problem: <principal problem not specified> Discharge Diagnoses: Active Problems:   MDD (major depressive disorder), severe (HCC)  Past Medical History:  Past Medical History:  Diagnosis Date  . Anxiety   . Ectopic pregnancy   . PTSD (post-traumatic stress disorder)     Past Surgical History:  Procedure Laterality  Date  . APPENDECTOMY    . LIPOSUCTION     Family History: History reviewed. No pertinent family history. Family Psychiatric  History: uncertain Social History:  Social History   Substance and Sexual Activity  Alcohol Use Not Currently     Social History   Substance and Sexual Activity  Drug Use Yes  . Frequency: 7.0 times per week  . Types: Marijuana    Social History   Socioeconomic History  . Marital status: Single    Spouse name: Not on file  . Number of children: Not on file  . Years of education: Not on file  . Highest education level: Some college, no degree  Occupational History  . Not on file  Social Needs  . Financial resource strain: Patient refused  . Food insecurity:    Worry: Patient refused    Inability: Patient refused  . Transportation needs:    Medical: Patient refused    Non-medical: Patient refused  Tobacco Use  . Smoking status: Never Smoker  . Smokeless tobacco: Never Used  Substance and Sexual Activity  . Alcohol use: Not Currently  . Drug use: Yes    Frequency: 7.0 times per week    Types: Marijuana  . Sexual activity: Yes  Lifestyle  . Physical activity:    Days per week: Patient refused    Minutes per session: Patient refused  . Stress: Not on file  Relationships  . Social connections:    Talks on phone: Patient refused    Gets together: Patient refused    Attends religious service: Patient refused    Active  member of club or organization: Patient refused    Attends meetings of clubs or organizations: Patient refused    Relationship status: Patient refused  Other Topics Concern  . Not on file  Social History Narrative  . Not on file    Hospital Course:    Patient always insisted that she was not suicidal that her overdose was not intentional she just took an excessive amount of Xanax, knowing the stress of her family matters she packed her belongings with her more than usual amount of Xanax it is noted that she buys it off  the street she does not have a prescription and she is a cannabis dependent patient, but the bottom line is she is not suicidal she displayed no danger behaviors by Monday morning it was clear she could be discharged but no amount of motivational interviewing would allow her to see that cannabis and Xanax abuse off the street is not in her best interest and she refuses detox and rehab for these measures.  She is discharged home, I strongly recommend she stop these illicit compounds but she refuses.  But not suicidal or psychotic stable to go  Physical Findings: AIMS: Facial and Oral Movements Muscles of Facial Expression: None, normal Lips and Perioral Area: None, normal Jaw: None, normal Tongue: None, normal,Extremity Movements Upper (arms, wrists, hands, fingers): None, normal Lower (legs, knees, ankles, toes): None, normal, Trunk Movements Neck, shoulders, hips: None, normal, Overall Severity Severity of abnormal movements (highest score from questions above): None, normal Incapacitation due to abnormal movements: None, normal Patient's awareness of abnormal movements (rate only patient's report): No Awareness, Dental Status Current problems with teeth and/or dentures?: No Does patient usually wear dentures?: No  CIWA:  CIWA-Ar Total: 4 COWS:    Musculoskeletal: Strength & Muscle Tone: within normal limits Gait & Station: normal Patient leans: N/A  Psychiatric Specialty Exam: ROS  Blood pressure 106/75, pulse 72, temperature 98.1 F (36.7 C), resp. rate 20, height  (1.626 m), weight 77.1 kg.Body mass index is 29.18 kg/m.  General Appearance: Casual  Eye Contact::  Fair  Speech:  Clear and Coherent409  Volume:  Decreased  Mood:  Dysphoric  Affect:  Constricted and Flat  Thought Process:  Coherent  Orientation:  Full (Time, Place, and Person)  Thought Content:  Logical and Tangential  Suicidal Thoughts:  No  Homicidal Thoughts:  No  Memory:  Immediate;   Fair   Judgement:  Fair  Insight:  Shallow  Psychomotor Activity:  Normal  Concentration:  Good  Recall:  Good  Fund of Knowledge:Good  Language: Fair  Akathisia:  Negative  Handed:  Right  AIMS (if indicated):     Assets:  Communication Skills Desire for Improvement  Sleep:  Number of Hours: 5.75  Cognition: WNL  ADL's:  Intact       Have you used any form of tobacco in the last 30 days? (Cigarettes, Smokeless Tobacco, Cigars, and/or Pipes): No  Has this patient used any form of tobacco in the last 30 days? (Cigarettes, Smokeless Tobacco, Cigars, and/or Pipes) Yes, No  Blood Alcohol level:  Lab Results  Component Value Date   ETH <10 11/23/2018    Metabolic Disorder Labs:  No results found for: HGBA1C, MPG No results found for: PROLACTIN No results found for: CHOL, TRIG, HDL, CHOLHDL, VLDL, LDLCALC  See Psychiatric Specialty Exam and Suicide Risk Assessment completed by Attending Physician prior to discharge.  Discharge destination:  Home  Is patient on multiple antipsychotic  therapies at discharge:  No   Has Patient had three or more failed trials of antipsychotic monotherapy by history:  No  Recommended Plan for Multiple Antipsychotic Therapies: NA   Allergies as of 11/25/2018      Reactions   Other Hives, Nausea And Vomiting, Other (See Comments)   Alcohol- Projectile vomits, passes out, develops a fever, and turns red from head-to-toes (also)   Tape Rash, Other (See Comments)   ONLY PAPER TAPE IS TOLERATED      Medication List    STOP taking these medications   acetaminophen 500 MG tablet Commonly known as:  TYLENOL   ALPRAZolam 1 MG tablet Commonly known as:  XANAX     TAKE these medications     Indication  b complex vitamins tablet Take 1 tablet by mouth daily.  Indication:  Vitamin Deficiency   BEE POLLEN PO Take 1 capsule by mouth daily.  Indication:  21-Hydroxylase Deficiency   COLLAGEN PO Take 1 capsule by mouth daily.  Indication:   21-Hydroxylase Deficiency   Ferrous Sulfate 220 (44 Fe) MG/5ML Liqd Take 220 mg by mouth See admin instructions. Swallow 220 mg (5 ml's) by mouth 1-2 times a day  Indication:  Anemia From Inadequate Iron in the Body   folic acid 800 MCG tablet Commonly known as:  FOLVITE Take 800 mcg by mouth daily.  Indication:  Anemia From Inadequate Folic Acid   One-A-Day Womens Formula Tabs Take 1 tablet by mouth daily.  Indication:  21-Hydroxylase Deficiency   TURMERIC PO Take 1 capsule by mouth daily.  Indication:  21-Hydroxylase Deficiency   VITAMIN C PO Take 1 tablet by mouth See admin instructions. 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           Follow-up recommendations:  Activity:  full SignedMalvin Johns, MD 11/25/2018, 9:26 AM

## 2018-11-25 NOTE — [facility] Suicide Risk Assessment (Addendum)
[facility] INPATIENT:  Family/Significant Other Suicide Prevention Education  Suicide Prevention Education:  Contact Attempts: Pt's father, Bianca Pitts [phone] been identified by the patient as the family member/significant other with whom the patient will be residing, and identified as the person(s) who will aid the patient in the event of a mental health crisis.  With written consent from the patient, two attempts were made to provide suicide prevention education, prior to and/or following the patient's discharge.  We were unsuccessful in providing suicide prevention education.  A suicide education pamphlet was given to the patient to share with family/significant other.  Date and time of first attempt:11/25/2018 @ 10:15am Date and time of second attempt: 11/25/2018 @ 10:45am    CSW contacted number which ended in a dial tone on both times with no ability to leave a VM.   Delphia Grates 11/25/2018, 10:27 AM

## 2018-11-25 NOTE — Progress Notes (Signed)
Recreation Therapy Notes  Date:  1.22.20 Time: 0930 Location: 300 Hall Dayroom  Group Topic: Stress Management  Goal Area(s) Addresses:  Patient will identify positive stress management techniques. Patient will identify benefits of using stress management post d/c.  Intervention: Stress Management  Activity :  Meditation.  LRT introduced the stress management technique of meditation.  LRT played Pitts meditation that focused on making the most of your day.  Patients were to listen and follow as meditation played to engage in activity.  Education:  Stress Management, Discharge Planning.   Education Outcome: Acknowledges Education  Clinical Observations/Feedback: Pt did not attend group.     Asahd Can, LRT/CTRS         Bianca Pitts 11/25/2018 11:49 AM 

## 2018-11-25 NOTE — Progress Notes (Signed)
  [facility] Adult Case Management Discharge Plan :  Will you be returning to the same living situation after discharge:  Yes,  home in [place]  At discharge, do you have transportation home?: Yes,  uber to the airport to fly back to [place], [place] Do you have the ability to pay for your medications: Yes,  private insurance  Release of information consent forms completed and in the chart;  Patient's signature needed at discharge.  Patient to Follow up at: Follow-up Information    [facility] Follow up.   Why:  Please follow up with clinic within 3 days of discharge to schedule a hospital discharge appointment. Be sure to bring your photo ID, proof of insurance, current medications, and any discharge paperwork from this hospitalization.  Contact information: [address] [place] [address] Ph: [phone] Fx: [phone]          Next level of care provider has access to [facility] Community Link:no  Safety Planning and Suicide Prevention discussed: No.; Two attempts were made for pt's father but unable to get ahold of pt's father.   Have you used any form of tobacco in the last 30 days? (Cigarettes, Smokeless Tobacco, Cigars, and/or Pipes): No  Has patient been referred to the Quitline?: Patient refused referral  Patient has been referred for addiction treatment: Yes  Delphia Grates, LCSW 11/25/2018, 10:47 AM

## 2018-11-25 NOTE — [facility] Suicide Risk Assessment (Signed)
[facility] Discharge Suicide Risk Assessment   Principal Problem: Overdose on Xanax/cannabis dependence/denies suicide attempt Discharge Diagnoses: Active Problems:   MDD (major depressive disorder), severe (HCC)   Total Time spent with patient: 45 minutes  Musculoskeletal: Strength & Muscle Tone: within normal limits Gait & Station: normal Patient leans: N/A  Psychiatric Specialty Exam: ROS  Blood pressure 106/75, pulse 72, temperature 98.1 F (36.7 C), resp. rate 20, height 5\' 4"  (1.626 m), weight 77.1 kg.Body mass index is 29.18 kg/m.  General Appearance: Casual  Eye Contact::  Fair  Speech:  Clear and Coherent409  Volume:  Decreased  Mood:  Dysphoric  Affect:  Constricted and Flat  Thought Process:  Coherent  Orientation:  Full (Time, Place, and Person)  Thought Content:  Logical and Tangential  Suicidal Thoughts:  No  Homicidal Thoughts:  No  Memory:  Immediate;   Fair  Judgement:  Fair  Insight:  Shallow  Psychomotor Activity:  Normal  Concentration:  Good  Recall:  Good  Fund of Knowledge:Good  Language: Fair  Akathisia:  Negative  Handed:  Right  AIMS (if indicated):     Assets:  Communication Skills Desire for Improvement  Sleep:  Number of Hours: 5.75  Cognition: WNL  ADL's:  Intact   Mental Status Per Nursing Assessment::   On Admission:  NA  Demographic Factors:  NA  Loss Factors: NA  Historical Factors: Anniversary of important loss  Risk Reduction Factors:   Sense of responsibility to family and Religious beliefs about death  Continued Clinical Symptoms:  Alcohol/Substance Abuse/Dependencies  Cognitive Features That Contribute To Risk:  None    Suicide Risk:  Minimal: No identifiable suicidal ideation.  Patients presenting with no risk factors but with morbid ruminations; may be classified as minimal risk based on the severity of the depressive symptoms  Follow-up Information    Resources needed in [place] Follow up.          As  previous admission patient's greatest risk is a non-intentional overdose which she is not suicidal her story is consistent she denies wanting to harm self or others, unfortunately she does not want to give up cannabis dependency and Xanax abuse and I cannot convince her towards detox or rehab measures despite motivational interviewing  Plan Of Care/Follow-up recommendations:  Activity:  full  Jalene Demo, MD 11/25/2018, 7:42 AM

## 2018-11-25 NOTE — Progress Notes (Signed)
Discharge note: Patient reviewed discharge paperwork with RN including prescriptions, follow up appointments, and lab work. Patient given the opportunity to ask questions. All concerns were addressed. All belongings were returned to patient. Denied SI/HI/AVH. Patient thanked staff for their care while at the hospital.  Patient was discharged to lobby where she was going to order an [place].

## 2018-11-28 ENCOUNTER — Ambulatory Visit: Payer: PRIVATE HEALTH INSURANCE

## 2019-01-06 ENCOUNTER — Telehealth: Payer: PRIVATE HEALTH INSURANCE

## 2019-01-06 DIAGNOSIS — Z7184 Encounter for health counseling related to travel: Secondary | ICD-10-CM

## 2019-01-06 DIAGNOSIS — U071 COVID-19 virus detected: Secondary | ICD-10-CM

## 2019-01-06 NOTE — Progress Notes
CC:  No chief complaint on file.        SUBJECTIVE:      Patient Consent to Telehealth Questionnaire   [facility] TELEHEALTH PRECHECKIN QUESTIONS 01/06/2019   By clicking ''I Agree'', I consent to the below:  I Agree     - I agree  to be treated via a video visit and acknowledge that I may be liable for any relevant copays or coinsurance depending on my insurance plan.  - I understand that this video visit is offered for my convenience and I am able to cancel and reschedule for an in-person appointment if I desire.  - I also acknowledge that sensitive medical information may be discussed during this video visit appointment and that it is my responsibility to locate myself in a location that ensures privacy to my own level of comfort.  - I also acknowledge that I should not be participating in a video visit in a way that could cause danger to myself or to those around me (such as driving or walking).  If my provider is concerned about my safety, I understand that they have the right to terminate the visit.     She tested positive for COVID19.  Patient had a Tm 100.1 last "Sunday, and then it self resolved.  On 6/24 tested, and then positive result on 6/25.  Currently, no fever, no coughing.  But she cannot smell anything.  No runny nose, no congestion, no abd pain, and no diarrhea.  And no loss of taste.  3 weeks ago she traveled to [place], but no one she travelled with had COVID.  She attended a couple of social gatherings a week prior and no one was wearing a mask.  No one she was with has tested positive for COVID.      Liposuction revision is scheduled in [place] on 01/20/2019.      Patient is participating in the Mood Therapy and has a psychiatrist assigned to her, and next month is next appointment but recommended a psychiatrist on a regular basis  Her mother has been ill and she was the one resuscitating her and then in a coma for 2 weeks.  This started the battle of depression.  More recently, they have been preparing her for her actual death.  She had to say ''bye'' several times but she has been still living and not choosing hospice.  Patient also used more Xanax than normal, and she was admitted under psychiatric care as a suicide attempt.  She hasn't started Lexapro or Gabapentin.      14" -point ROS was performed.  All pertinent positives and negatives are included in the HPI.      MEDICATIONS:  Current Outpatient Medications   Medication Sig   ??? Ascorbic Acid (VITAMIN C) 100 MG tablet Take 1 tablet by mouth.   ??? b complex vitamins tablet Take 1 tablet by mouth.   ??? BEE POLLEN PO Take 1 capsule by mouth.   ??? ferrous sulfate 220 (44 Fe) mg/5 mL LIQD liquid Take 220 mg by mouth.   ??? folic acid 800 MCG tablet Take 800 mcg by mouth.   ??? Multiple Vitamins-Calcium (ONE-A-DAY WOMENS FORMULA) TABS Take 1 tablet by mouth.   ??? escitalopram 5 mg tablet Take 1 tablet (5 mg total) by mouth daily. (Patient not taking: Reported on 01/06/2019.)   ??? gabapentin 100 mg capsule Please take 1-3 capsules, up to twice a day, as needed for anxiety. (Patient not taking: Reported on 01/06/2019.)  No current facility-administered medications for this visit.        Allergies:    Allergies   Allergen Reactions   ??? Cloth Tape Other (See Comments) and Rash     ONLY PAPER TAPE IS TOLERATED       PMHx:  Patient Active Problem List   Diagnosis   ??? MDD (major depressive disorder), severe (HCC/RAF)       PSHx:  Past Surgical History:   Procedure Laterality Date   ??? APPENDECTOMY     ??? ECTOPIC PREGNANCY SURGERY      removed bilateral fallopian tubes   ??? LIPOSUCTION         Social Hx:  Social History     Social History Narrative   ??? Not on file         OBJECTIVE:    There were no vitals filed for this visit.    Gen: WAND  HEENT:  ATNC, PERRLA, EOMI B  SKIN:  No rash/lesion  EXT:  No C/C/E  NEURO:  CN 2-12 grossly intact, sensation intact throughout, motor 5/5 throughout       ASSESSMENT AND PLAN: # COVID positive on 01/01/2019 with symptoms on 6/21 after two birthday parties without distancing and no masks  - Stay in quarantine until 7/5 since her symptoms have resolved except loss of smell  - Retest prior to her going to [place] for liposuction surgery - ok for clinic testing as long as she continues to remain asymptomatic    # Major Depression and Anxiety:  - Seek Therapy through insurance  - Ref to Psychiatry    The above plan of care, diagnosis, orders, and follow-up were discussed with the patient.  Questions related to this recommended plan of care were answered.      Direct patient contact and coordination of care total time spent = 30 minutes.     Claretha Cooper, MD

## 2019-01-16 ENCOUNTER — Ambulatory Visit: Payer: Commercial Managed Care - HMO

## 2019-01-16 DIAGNOSIS — R6889 Other general symptoms and signs: Secondary | ICD-10-CM

## 2019-01-16 NOTE — Progress Notes
CC:  No chief complaint on file.        SUBJECTIVE:      Patient is here for COVID19 testing after testing positive on 6/24.  Currently she has continual loss of taste/smell.  Otherwise no other symptoms.  She will be going to [place] on 7/13 for liposuction.  Also wants Ab testing.    Per last PN:  She tested positive for COVID19.  Patient had a Tm 100.1 last Sunday, and then it self resolved.  On 6/24 tested, and then positive result on 6/25.  Currently, no fever, no coughing.  But she cannot smell anything.  No runny nose, no congestion, no abd pain, and no diarrhea.  And no loss of taste.  3 weeks ago she traveled to [place], but no one she travelled with had COVID.  She attended a couple of social gatherings a week prior and no one was wearing a mask.  No one she was with has tested positive for COVID.    ???  Liposuction revision is scheduled in [place] on 01/20/2019.        14-point ROS was performed.  All pertinent positives and negatives are included in the HPI.      MEDICATIONS:  Current Outpatient Medications   Medication Sig   ??? Ascorbic Acid (VITAMIN C) 100 MG tablet Take 1 tablet by mouth.   ??? b complex vitamins tablet Take 1 tablet by mouth.   ??? BEE POLLEN PO Take 1 capsule by mouth.   ??? escitalopram 5 mg tablet Take 1 tablet (5 mg total) by mouth daily. (Patient not taking: Reported on 01/06/2019.)   ??? ferrous sulfate 220 (44 Fe) mg/5 mL LIQD liquid Take 220 mg by mouth.   ??? folic acid 800 MCG tablet Take 800 mcg by mouth.   ??? gabapentin 100 mg capsule Please take 1-3 capsules, up to twice a day, as needed for anxiety. (Patient not taking: Reported on 01/06/2019.)   ??? Multiple Vitamins-Calcium (ONE-A-DAY WOMENS FORMULA) TABS Take 1 tablet by mouth.     No current facility-administered medications for this visit.        Allergies:    Allergies   Allergen Reactions   ??? Cloth Tape Other (See Comments) and Rash     ONLY PAPER TAPE IS TOLERATED       PMHx:  Patient Active Problem List   Diagnosis ??? MDD (major depressive disorder), severe (HCC/RAF)       PSHx:  Past Surgical History:   Procedure Laterality Date   ??? APPENDECTOMY     ??? ECTOPIC PREGNANCY SURGERY      removed bilateral fallopian tubes   ??? LIPOSUCTION         Social Hx:  Social History     Social History Narrative   ??? Not on file         OBJECTIVE:    There were no vitals filed for this visit.    Gen: WAND  HEENT:  ATNC, PERRLA, EOMI B  SKIN:  No rash/lesion  EXT:  No C/C/E  NEURO:  CN 2-12 grossly intact, sensation intact throughout, motor 5/5 throughout       ASSESSMENT AND PLAN:      32  y/o F with COVID19 tested on 6/24, going to [place] for Liposuction on 7/13, currently only symptom is loss of taste/smell.    - COVID19 NP swab today  - If negative, then will order COVID19 Ab test    The above plan of  care, diagnosis, orders, and follow-up were discussed with the patient.  Questions related to this recommended plan of care were answered.      Direct patient contact and coordination of care total time spent = 15 minutes.     Claretha Cooper, MD

## 2019-01-17 ENCOUNTER — Ambulatory Visit: Payer: PRIVATE HEALTH INSURANCE

## 2019-01-17 LAB — COVID-19 PCR

## 2019-10-14 ENCOUNTER — Ambulatory Visit: Payer: PRIVATE HEALTH INSURANCE

## 2019-10-14 DIAGNOSIS — Z23 Encounter for immunization: Secondary | ICD-10-CM

## 2021-08-14 ENCOUNTER — Non-Acute Institutional Stay: Payer: PRIVATE HEALTH INSURANCE

## 2021-08-15 ENCOUNTER — Inpatient Hospital Stay
Admit: 2021-08-15 | Discharge: 2021-08-15 | Disposition: A | Discharge status: Home / Self Care | Payer: PRIVATE HEALTH INSURANCE | Source: Home / Self Care

## 2021-08-15 DIAGNOSIS — R109 Unspecified abdominal pain: Secondary | ICD-10-CM

## 2021-08-15 DIAGNOSIS — N12 Tubulo-interstitial nephritis, not specified as acute or chronic: Secondary | ICD-10-CM

## 2021-08-15 LAB — Differential Automated: ABSOLUTE NEUT COUNT: 11.63 10*3/uL — ABNORMAL HIGH (ref 1.80–6.90)

## 2021-08-15 LAB — Pregnancy Test Urine, POC: PREG TEST URINE, POC: NEGATIVE

## 2021-08-15 LAB — Extra Light Green Top

## 2021-08-15 LAB — Basic Metabolic Panel
ANION GAP: 8 mmol/L (ref 8–19)
CREATININE: 0.83 mg/dL (ref 0.60–1.30)

## 2021-08-15 LAB — UA,Microscopic: SQUAMOUS EPITHELIAL CELLS: 0 {cells}/uL (ref 0–17)

## 2021-08-15 LAB — Albumin: ALBUMIN: 4 g/dL (ref 3.9–5.0)

## 2021-08-15 LAB — Amylase: AMYLASE: 32 U/L (ref 31–124)

## 2021-08-15 LAB — Pregnancy Test,Urine: PREGNANCY TEST,URINE: NEGATIVE

## 2021-08-15 LAB — CBC: NEUTROPHILS ABS (PRELIM): 11.63 10*3/uL (ref 4.16–9.95)

## 2021-08-15 LAB — Alanine Aminotransferase: ALANINE AMINOTRANSFERASE: 14 U/L (ref 8–70)

## 2021-08-15 LAB — Bilirubin,Conjugated: BILIRUBIN,CONJUGATED: <0.2 mg/dL (ref <=0.3)

## 2021-08-15 LAB — Aspartate Aminotransferase: ASPARTATE AMINOTRANSFERASE: 14 U/L (ref 13–62)

## 2021-08-15 LAB — UA,Dipstick

## 2021-08-15 LAB — Lipase: LIPASE: 22 U/L (ref 13–69)

## 2021-08-15 LAB — Alkaline Phosphatase: ALKALINE PHOSPHATASE: 58 U/L (ref 37–113)

## 2021-08-15 MED ORDER — CEPHALEXIN 500 MG PO CAPS
500 mg | ORAL_CAPSULE | Freq: Four times a day (QID) | ORAL | 0 refills | Status: AC
Start: 2021-08-15 — End: ?

## 2021-08-15 MED ORDER — IBUPROFEN 600 MG PO TABS
600 mg | ORAL_TABLET | Freq: Four times a day (QID) | ORAL | 0 refills | Status: AC | PRN
Start: 2021-08-15 — End: ?

## 2021-08-15 MED ORDER — ACETAMINOPHEN-CODEINE #3 300-30 MG PO TABS
1-2 | ORAL_TABLET | Freq: Four times a day (QID) | ORAL | 0 refills | Status: AC | PRN
Start: 2021-08-15 — End: ?

## 2021-08-15 MED ADMIN — POTASSIUM CHLORIDE CRYS ER 20 MEQ PO TBCR: 40 meq | ORAL | @ 09:00:00 | Stop: 2021-08-15 | NDC 00245531989

## 2021-08-15 MED ADMIN — MORPHINE SULFATE 4 MG/ML IV SOLN: 4 mg | INTRAVENOUS | @ 09:00:00 | Stop: 2021-08-15 | NDC 00641612501

## 2021-08-15 MED ADMIN — KETOROLAC TROMETHAMINE 30 MG/ML IJ SOLN: 30 mg | INTRAVENOUS | @ 08:00:00 | Stop: 2021-08-15 | NDC 72266011801

## 2021-08-15 MED ADMIN — CEFTRIAXONE 1 GM/50 ML RTU: 1 g | INTRAVENOUS | @ 09:00:00 | Stop: 2021-08-15 | NDC 00338500241

## 2021-08-15 NOTE — ED Provider Notes
[facility]  Emergency Department Service Report    Sarah Whitehead 36 y.o. female , presents with Abdominal Pain      Triage   Arrived on 08/14/2021 at 9:43 PM   Arrived by Wheelchair [4]    ED Triage Vitals   Temp Temp Source BP Heart Rate Resp SpO2 O2 Device Pain Score Weight   08/14/21 2148 08/14/21 2148 08/14/21 2148 08/14/21 2148 08/14/21 2148 08/14/21 2148 08/15/21 0035 08/15/21 0100 08/14/21 2347   36.9 ?C (98.4 ?F) Oral 122/93 (!) 109 20 97 % None (Room air) Patient [place] 74.8 kg (165 lb)       Pre hospital care:       Allergies   Allergen Reactions   ? Cloth Tape Other (See Comments) and Rash     ONLY PAPER TAPE IS TOLERATED       History   The history is provided by the patient. No language interpreter was used.      Sarah Whitehead is a 36 y.o. female with a history of appendectomy and ectopic pregnancy surgery who presents to the ED for evaluation of RLQ pain that radiates to her back beginning 3 days ago. Pain is constant, moderate, and gradually worsening 7-8/10 and worse at night. Pt states pain is dull during the day but worse at night.  Pt has decreased appetite and states that she normally doesn't have cramps with her period, but saw of blood today. She denies dysuria, n/v, and fever.Took ibuprofen and tylenol, no alleviation of symptoms. LMP Jan. 1, quite irregular.   Past Medical History:   Diagnosis Date   ? Tailbone injury     breathing tx with medical marijuana        Past Surgical History:   Procedure Laterality Date   ? APPENDECTOMY     ? ECTOPIC PREGNANCY SURGERY      removed bilateral fallopian tubes   ? LIPOSUCTION          Past Family History   family history includes Amyloid in her maternal grandmother and mother; Diabetes in her maternal grandfather; Stomach cancer in her maternal grandfather.     Past Social History   she reports that she has never smoked. She has never used smokeless tobacco. She reports current drug use. Drug: Marijuana. She reports that she does not drink alcohol. No history on file for sexual activity.       Physical Exam   Physical Exam  Vitals and nursing note reviewed.   Constitutional:       Appearance: She is well-developed.   HENT:      Head: Normocephalic and atraumatic.   Eyes:      Pupils: Pupils are equal, round, and reactive to light.   Cardiovascular:      Heart sounds: Normal heart sounds.   Pulmonary:      Breath sounds: Normal breath sounds.   Abdominal:      General: Bowel sounds are normal.      Palpations: Abdomen is soft.      Tenderness: There is abdominal tenderness in the right lower quadrant. There is right CVA tenderness.   Musculoskeletal:         General: Normal range of motion.      Cervical back: Neck supple.   Skin:     General: Skin is dry.   Neurological:      Mental Status: She is alert and oriented to person, place, and time.   Psychiatric:  Mood and Affect: Mood normal.         Behavior: Behavior is cooperative.       ED Course          Laboratory Results     Labs Reviewed   BASIC METABOLIC PANEL - Abnormal; Notable for the following components:       Result Value    Potassium 2.9 (*)     Glucose 129 (*)     Urea Nitrogen 6 (*)     All other components within normal limits   CBC (PERFORMABLE) - Abnormal; Notable for the following components:    White Blood Cell Count 16.63 (*)     Mean Corpuscular Volume 99.2 (*)     All other components within normal limits   DIFFERENTIAL, AUTOMATED (PERFORMABLE) - Abnormal; Notable for the following components:    Absolute Neut Count 11.63 (*)     Absolute Lymphocyte Count 3.51 (*)     Absolute Mono Count 1.28 (*)     Absolute Immature Gran Count 0.06 (*)     All other components within normal limits   UA,DIPSTICK - Abnormal; Notable for the following components:    Urine Color [place] (*)     Blood 2+ (*)     Protein 2+ (*)     Leukocyte Esterase 3+ (*)     Nitrite 2+ (*)     All other components within normal limits   UA,MICROSCOPIC - Abnormal; Notable for the following components:    RBC per uL 195 (*)     WBC per uL >1,000 (*)     RBC per HPF 41 (*)     WBC per HPF >200 (*)     Bacteria Present (*)     All other components within normal limits   ALT (SGPT) - Normal   AST (SGOT) - Normal   BILIRUBIN,CONJ - Normal   ALKALINE PHOSPHATASE - Normal   ALBUMIN - Normal   LIPASE - Normal   AMYLASE - Normal   PREGNANCY TEST,URINE - Normal   POCT URINE PREGNANCY - Normal   RAINBOW DRAW TO LABORATORY    Narrative:     The following orders were created for panel order [facility] Draw to Laboratory [place] Gallant).  Procedure                               Abnormality         Status                     ---------                               -----------         ------                     Extra Burna Mortimer ZOX[[id number]]                            Final result                 Please view results for these tests on the individual orders.   CBC & AUTO DIFFERENTIAL    Narrative:     The following orders were created for panel order CBC & Plt & Diff.  Procedure  Abnormality         Status                     ---------                               -----------         ------                     ZOX[[id number]]                          Abnormal            Final result               Differential, Automated[[id number]]      Abnormal            Final result                 Please view results for these tests on the individual orders.   EXTRA LIGHT GREEN TOP   URINALYSIS W/REFLEX TO CULTURE    Narrative:     The following orders were created for panel order Urinalysis w/Reflex to Culture.  Procedure                               Abnormality         Status                     ---------                               -----------         ------                     UA,Dipstick[[id number]]                  Abnormal            Final result               UA,Microscopic[[id number]]               Abnormal            Final result                 Please view results for these tests on the individual orders.   PREGNANCY TEST URINE, POC       Imaging Results     CT kub wo contrast   Final Result by Dierdre Highman, MD (02/05 2359)   IMPRESSION:      No acute CT abnormality in the abdomen or pelvis. No nephrolithiasis. No hydronephrosis.   The appendix is surgically absent.      I, Dierdre Highman, M.D., have personally reviewed this radiological study and agree with the provided report.      Dictated by: Burt Ek   08/14/2021 11:47 PM      Signed by: Dierdre Highman   08/14/2021 11:59 PM          Administered Medications     Medication Administration from 08/14/2021 2143 to 08/18/2021 1020       Date/Time Order Dose Route Action Action by Comments     08/14/2021 2353  PST ketorolac 30 mg/mL inj 30 mg 30 mg IV Push Given Serita Grammes, RN --     08/15/2021 0054 PST cefTRIAXone 1 g in dextrose 50 mL IVPB RTU 0 g Intravenous Stopped Maudry Mayhew, RN --     08/15/2021 0033 PST cefTRIAXone 1 g in dextrose 50 mL IVPB RTU 1 g Intravenous New Bag/ Syringe/ Cartridge Maudry Mayhew, RN --     08/15/2021 0033 PST potassium chloride ER tab 40 mEq 40 mEq Oral Given Maudry Mayhew, RN --     08/15/2021 0126 PST morphine 4 mg/mL inj 4 mg 4 mg IV Push Given Serita Grammes, RN --          Procedures   Procedural Sedation  Procedures    Medical Decision Making     MDM  Differential considerations include: Pyelonephritis, Ureteral Stone, Musculoskeletal strain,  UTI  Patient presents with RLQ and flank pain.  Labs were obtained to evaluate for electrolyte abnormalities such as hyponatremia, hyperglycemia or acidosis and were found to be within normal limits. Noted mild hypokalemia.  No evidence of renal insufficiency.  Urine reveals evidence of infection. Radiology studies, CT KUB reveals no hydronephrosis, ureteral stone or acute intra-abdominal pathology. Patient markedly improved in ER with Potassium chloride repletion, IV Ceftriaxone, IV Morphine, IV Toradol.  Shared decision making concerning pyelonephritis, possible admission.  Patient agreeable to  outpatient antibiotics and close followup and strict RTER precautions for persistent vomiting and fever.  Advised close follow up with primary physician.   Return to ED instructions given at length and patient verbalized understanding of these instructions.  BP 98/68  ~ Pulse 68  ~ Temp 37 ?C (98.6 ?F) (Oral)  ~ Resp 16  ~ Wt 74.8 kg (165 lb)  ~ LMP 07/07/2021 (Approximate)  ~ SpO2 96%  ~ BMI 28.32 kg/m?       Clinical Impression     1. Right sided abdominal pain    2. Pyelonephritis          Prescriptions     Discharge Medication List as of 08/15/2021  2:47 AM      START taking these medications    Details   acetaminophen-codeine (TYLENOL #3) 300-30 mg tablet Take 1-2 tablets by mouth every six (6) hours as needed. Max Daily Amount: 8 tablets, Starting Mon 08/15/2021, Normal      cephalexin 500 mg capsule Take 1 capsule (500 mg total) by mouth four (4) times daily., Starting Mon 08/15/2021, Normal      ibuprofen 600 mg tablet Take 1 tablet (600 mg total) by mouth every six (6) hours as needed., Starting Mon 08/15/2021, Normal             Disposition and Follow-up   Disposition: Discharge [1]    No future appointments.    Follow up with:  Claretha Cooper., MD  [address], [address]  [place] [address]  [phone]    Schedule an appointment as soon as possible for a visit in 2 days      [facility]  [address]  [place] [address]  [phone]    As needed, If symptoms worsen    Return precautions are specified on After Visit Summary.    The documentation on this chart was performed by Orlan Leavens, scribed for Gaetana Michaelis D., DO  08/14/2021 10:57 PM   All scribe entries and documentation made by the scribe were entered  at my direction.  I have reviewed this medical record and agree to the accuracy and completeness of the content entered by the scribe.  The documentation recorded by the scribe accurately reflects the service I personally performed and the decisions made by me.  HV              Gaetana Michaelis D., DO  08/18/21 1026

## 2021-08-16 LAB — Bacterial Culture Urine: BACTERIAL CULTURE URINE: >100000 — AB

## 2021-08-17 LAB — Bacterial Culture Urine
BACTERIAL CULTURE URINE: >100000 — AB
BACTERIAL CULTURE URINE: >100000 — AB

## 2021-08-17 NOTE — Consults
Bacterial Culture Urine  Bacterial Culture urine results: >100,00 CFU/ml E coli  Abx given in ER at time of discharge: cephalexin 500mg  po QID # 28  Abx changed: no change  MD: Dr.  Patient notified: No, no new orders    Note: No refills. Important to complete full course of antibiotics. MD signed verbal/written order for upload on [facility]

## 2021-10-11 ENCOUNTER — Inpatient Hospital Stay
Admit: 2021-10-11 | Discharge: 2021-10-11 | Disposition: A | Discharge status: Home / Self Care | Payer: Commercial Managed Care - HMO | Source: Home / Self Care

## 2021-10-11 ENCOUNTER — Ambulatory Visit: Payer: PRIVATE HEALTH INSURANCE

## 2021-10-11 DIAGNOSIS — S161XXA Strain of muscle, fascia and tendon at neck level, initial encounter: Secondary | ICD-10-CM

## 2021-10-11 DIAGNOSIS — T1490XA Injury, unspecified, initial encounter: Secondary | ICD-10-CM

## 2021-10-11 MED ADMIN — KETOROLAC TROMETHAMINE 30 MG/ML IJ SOLN: 30 mg | INTRAMUSCULAR | @ 09:00:00 | Stop: 2021-10-11

## 2021-10-11 MED ADMIN — KETOROLAC TROMETHAMINE 30 MG/ML IJ SOLN: 30 mg | INTRAVENOUS | @ 09:00:00 | Stop: 2021-10-11 | NDC 72266011825

## 2021-10-11 MED ADMIN — HYDROCODONE-ACETAMINOPHEN 5-325 MG PO TABS: 1 | ORAL | @ 09:00:00 | Stop: 2021-10-11 | NDC 68084089511

## 2021-10-11 MED ADMIN — ONDANSETRON HCL 4 MG/2ML IJ SOLN: 4 mg | INTRAVENOUS | @ 09:00:00 | Stop: 2021-10-11 | NDC 60505613000
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
## 2021-10-11 NOTE — ED Notes
Patient signed pregnancy refusal form, hx of double fallopian tube removal. Per patient no chance of pregnancy for ketolorac. Patient also requesting to have ketolorac IV push. Dr. Lucia Gaskins notified, ok to place IV for medication.

## 2023-11-08 ENCOUNTER — Ambulatory Visit: Payer: Commercial Managed Care - HMO

## 2023-11-08 ENCOUNTER — Inpatient Hospital Stay
Admit: 2023-11-08 | Discharge: 2023-11-08 | Disposition: A | Discharge status: Home / Self Care | Payer: Commercial Managed Care - HMO | Source: Home / Self Care

## 2023-11-08 DIAGNOSIS — M4802 Spinal stenosis, cervical region: Secondary | ICD-10-CM

## 2023-11-08 DIAGNOSIS — R109 Unspecified abdominal pain: Secondary | ICD-10-CM

## 2023-11-08 DIAGNOSIS — M542 Cervicalgia: Secondary | ICD-10-CM

## 2023-11-08 DIAGNOSIS — N83209 Unspecified ovarian cyst, unspecified side: Secondary | ICD-10-CM

## 2023-11-08 LAB — Pregnancy Test,Blood: PREGNANCY TEST,BLOOD: NEGATIVE

## 2023-11-08 LAB — CBC: HEMOGLOBIN: 13.1 g/dL (ref 11.6–15.2)

## 2023-11-08 LAB — Lipase: LIPASE: 51 U/L (ref 13–69)

## 2023-11-08 LAB — Comprehensive Metabolic Panel
ASPARTATE AMINOTRANSFERASE: 23 U/L (ref 13–62)
GLUCOSE: 118 mg/dL — ABNORMAL HIGH (ref 65–99)

## 2023-11-08 LAB — Differential Automated: EOSINOPHIL PERCENT, AUTO: 1.9 % (ref 0.00–0.04)

## 2023-11-08 MED ADMIN — ONDANSETRON HCL 4 MG/2ML IJ SOLN: 4 mg | INTRAVENOUS | @ 10:00:00 | Stop: 2023-11-08 | NDC 60505613000

## 2023-11-08 MED ADMIN — IOHEXOL 350 MG/ML IV SOLN: 100 mL | INTRAVENOUS | @ 13:00:00 | Stop: 2023-11-08 | NDC 00407141491

## 2023-11-08 MED ADMIN — MORPHINE SULFATE 4 MG/ML IV/IJ SOLN (MULTI-GPI): 4 mg | INTRAVENOUS | @ 10:00:00 | Stop: 2023-11-08 | NDC 00641612501

## 2023-11-08 MED ADMIN — PROCHLORPERAZINE EDISYLATE 10 MG/2ML IJ SOLN: 10 mg | INTRAVENOUS | @ 11:00:00 | Stop: 2023-11-08 | NDC 25021079002

## 2023-11-08 MED ADMIN — LORAZEPAM 2 MG/ML IJ SOLN: 1 mg | INTRAVENOUS | @ 10:00:00 | Stop: 2023-11-08 | NDC 00641600101

## 2023-11-08 NOTE — Discharge Instructions
 You have worsening disease in your neck likely causing the pain but nothing is pressing on your spinal cord. Your labs and CT scan looked alright. Please return for worsening pain, weakness, numbness, or any other concerns.      Emergency Department Discharge Instructions      Summary of your visit  You have been evaluated in the [facility] Emergency Department today for abdominal pain.  Your evaluation included a physical exam and labs and CT scan.  We have not found a serious cause of the abdominal pain at this time. However, some abdominal problems may take more time to appear. Therefore, it is important that you carefully watch for changes in the abdominal pain (such as any new symptoms or worsening of your current condition).     Follow-Up  Please follow up with your primary care physician within three days after being discharged from the ER - you can call to schedule an appointment.  You can find a primary care physician at [facility] by calling [phone].    You can also follow-up with a gastroenterologist at [facility] by calling [phone].  If you do not have a Primary Care Physician, please call your insurance company or you may call [phone] to establish care with a [facility] physician.  If you are uninsured, please call 2-1-1 to find a free or low-cost clinic in your area. 2[facility]1 [place] is the central source for providing information and referrals for all health and human services in [facility]. Our 2-1-1 phone line is open 24 hours, 7 days a week, with trained [employer] prepared to offer help with any situation, any time. Our community services go far beyond phone referrals - explore our website to learn more. If you are calling from outside [facility] or cannot directly dial 2-1-1, you can call [phone].      Return to the Emergency Department if you experience:  Fevers 100.4 ?F or greater  Worsening or uncontrolled pain  Persistent nausea and vomiting  Inability to tolerate food or fluids by mouth  Bloody stools or vomit  Black or tarry stools  Any other concerning symptoms     Thank you for choosing [facility] for your care. It was a pleasure taking part in your care today, and we wish you the best!

## 2023-11-08 NOTE — ED Notes
 Marlin, MRI tech, verified that the patient can go to MRI with a retainer.

## 2023-11-08 NOTE — ED Notes
 Patient transported to MRI

## 2023-11-08 NOTE — ED Notes
 Patient sleeping comfortably. Chest rise and fall noted. Snoring noted.

## 2023-11-08 NOTE — ED Provider Notes
 [facility]  Emergency Department Service Report    Sarah Whitehead 38 y.o. female , presents with Back Pain    Triage   Arrived on 11/08/2023 at 2:05 AM   Arrived by Walk-in [14]    ED Triage Vitals   Temp Temp Source BP Heart Rate Resp SpO2 O2 Device Pain Score Weight   11/08/23 0215 11/08/23 0215 11/08/23 0215 11/08/23 0215 11/08/23 0215 11/08/23 0215 11/08/23 0400 11/08/23 0353 11/08/23 0213   36.8 ?C (98.2 ?F) Oral 111/81 (!) 104 18 94 % None (Room air) Eight 77.1 kg (170 lb)       Pre hospital care:       Allergies   Allergen Reactions    Cloth Tape Other (See Comments) and Rash     ONLY PAPER TAPE IS TOLERATED         Initial Physician Contact       Initial Contact Completed?: Yes (11/08/23 0214)      History   HPI     38 year old with a history of psychiatric disease evaluated in the chart presenting with neck pain abdominal pain.  She has had chronic neck pain for multiple years.  States it is in the process of getting evaluated for this.  Is awaiting referrals.  Is seen by pain management and on gabapentin diclofenac.  She recently ran out but has refills at the pharmacy.  He states the pain has gotten worse and now feels weakness in her hands.  She states the other day it was hard for her to hold her pencil.  No bowel or bladder dysfunction.  He states when she walks for long periods of time that her legs do feel like they are about to give out but this is not new.  She also has had some abdominal pain.  Started earlier today.  Has been constant.  She states left-sided.  No nausea vomiting.  No vaginal discharge.  Recently had sexual contact and did not use protection.  Has had diarrhea without blood in it.  Has had some diarrhea.  No blood in it.  No fever cough congestion chest pain or shortness of breath.  No travel.    Uses cannabis every day but it has not had GI symptoms from it.  Also uses Molly and cocaine occasionally.  Language Assistance                         Past Medical History:   Diagnosis Date    Tailbone injury     breathing tx with medical marijuana        Past Surgical History:   Procedure Laterality Date    APPENDECTOMY      ECTOPIC PREGNANCY SURGERY      removed bilateral fallopian tubes    LIPOSUCTION          Past Family History   family history includes Amyloid in her maternal grandmother and mother; Diabetes in her maternal grandfather; Stomach cancer in her maternal grandfather.                 Past Social History   she reports that she has never smoked. She has never used smokeless tobacco. She reports current drug use. Drug: Marijuana. She reports that she does not drink alcohol. No history on file for sexual activity.       Physical Exam   Physical Exam  Vitals and nursing note reviewed.   Constitutional:  Appearance: Normal appearance.   HENT:      Head: Normocephalic.      Nose: Nose normal.      Mouth/Throat:      Mouth: Mucous membranes are dry.   Eyes:      Extraocular Movements: Extraocular movements intact.      Conjunctiva/sclera: Conjunctivae normal.      Pupils: Pupils are equal, round, and reactive to light.   Cardiovascular:      Rate and Rhythm: Normal rate and regular rhythm.      Pulses: Normal pulses.      Heart sounds: Normal heart sounds.   Pulmonary:      Effort: Pulmonary effort is normal.      Breath sounds: Normal breath sounds.   Abdominal:      General: Abdomen is flat. Bowel sounds are normal.      Palpations: Abdomen is soft.      Comments: Has diffuse abdominal tenderness without rebound or guarding   Musculoskeletal:         General: Normal range of motion.      Cervical back: Normal range of motion and neck supple.   Skin:     Capillary Refill: Capillary refill takes less than 2 seconds.   Neurological:      General: No focal deficit present.      Mental Status: She is alert and oriented to person, place, and time.      Comments: 5/5 strength in legs, 4+ out of 5 in hands, otherwise rest of her arms has 5/5 strength    Cranial nerves are intact    2+ pulses cap refill less than 2 seconds    Is able to move her neck with full range motion   Psychiatric:         Mood and Affect: Mood normal.         Behavior: Behavior normal.         ED Course     ED Course as of 11/08/23 1426   Thu Nov 08, 2023   0218 Reviewed old chart [ES]   0253 Patient resting comfortably. [ES]   0425 Pt sleeping [ES]   0510 Pt continues to sleep comfortably. Awaiting CT scan. [ES]   8119 Received sign-out from Dr. Anthony Bateman. Pending to-do's: [ ]  F/u CT Abd/pelvis. NTD re: MRI C-spine. [RH]   0717 CT abd+pelvis w contrast  Upon independent evaluation, there appears to be a hemorrhagic cyst. Discussed w/pt SRP and f/u with PCP. Will dc to home. [RH]      ED Course User Index  [ES] Christiana Cower., MD  [RH] Marena Shanks, MD       Laboratory Results     Labs Reviewed   COMPREHENSIVE METABOLIC PANEL - Abnormal; Notable for the following components:       Result Value    Glucose 118 (*)     All other components within normal limits   CBC (PERFORMABLE) - Abnormal; Notable for the following components:    White Blood Cell Count 12.88 (*)     All other components within normal limits   DIFFERENTIAL, AUTOMATED (PERFORMABLE) - Abnormal; Notable for the following components:    Absolute Neut Count 8.65 (*)     Absolute Immature Gran Count 0.05 (*)     All other components within normal limits   LIPASE - Normal   PREGNANCY TEST,BLOOD - Normal   CBC & AUTO DIFFERENTIAL    Narrative:     The  following orders were created for panel order CBC & Auto Differential.  Procedure                               Abnormality         Status                     ---------                               -----------         ------                     ZOX[[id number]]                          Abnormal            Final result               Differential, Automated[[id number]]      Abnormal            Final result                 Please view results for these tests on the individual orders.       Imaging Results CT abd+pelvis w contrast   Final Result by Joellen Muscat, MD (05/01 8119)   IMPRESSION:      1.  No CT correlate for the patient's abdominal pain. No acute CT abnormality in abdomen or pelvis.   2.  Right adnexal 41 mm cystic lesion is favored to represent a hemorrhagic cyst; this may be confirmed with pelvic ultrasound on a nonemergent basis if clinically indicated.      I, Joellen Muscat, M.D., have reviewed this radiological study personally and I am in full agreement with the findings of the report presented here.      Dictated by: Rodger Civil   11/08/2023 6:35 AM      Signed by: Joellen Muscat   11/08/2023 6:40 AM      MR cervical spine wo contrast   Final Result by Loreen Roers, MD (05/01 1478)   IMPRESSION:      Progression of cervical degenerative disc disease since 2013 most severe at C5-C6 resulting in moderate canal and foraminal stenosis, however without associated spinal cord signal abnormality.      I, Loreen Roers, M.D., have personally reviewed this radiological study and agree with the provided report.      Dictated by: Rodger Civil   11/08/2023 4:13 AM      Signed by: Loreen Roers   11/08/2023 4:27 AM          Administered Medications     Medication Administration from 11/08/2023 0205 to 11/08/2023 1426         Date/Time Order Dose Route Action Action by Comments     11/08/2023 0314 PDT ondansetron 4 mg/2 mL inj 4 mg 4 mg Intravenous Given Mercado, Wilther [place], RN --     11/08/2023 0314 PDT morphine 4 mg/mL inj 4 mg 4 mg IV Push Given Mercado, Wilther [place], RN --     11/08/2023 0314 PDT LORazepam 2 mg/mL inj 1 mg 1 mg IV Push Given Mercado, Wilther [place], RN --     11/08/2023 [phone] PDT prochlorperazine 10 mg/2 mL inj 10 mg 10 mg IV Push  Given Mercado, Wilther [place], RN --     11/08/2023 0600 PDT iohexol (Omnipaque) 350 mg/mL inj 100 mL 100 mL Intravenous Given [place] 34ml/sec            morphine 4 mg/mL inj 4 mg, LORazepam 2 mg/mL inj 1 mg is a High-risk medication as it is a parenteral controlled substance.          Procedures   Procedural Sedation  Procedures    Medical Decision Making   Kyla Fantauzzi is a 38 y.o. female  with the abdominal pain and back pain.  The paresthesias and numbness to her arms are chronic and not changed.  However she states she is having more weakness in her grips.  Will MRI her C-spine to rule out cord compression.  Do not believe she has diskitis or an abscess.  Likely worsening of her chronic condition.  She also is having abdominal pain and diarrhea.  Do not believe it is related.  Differential includes hepatitis, pancreatitis, PID, UTI, torsion, cyst, diverticulitis.  Will obtain labs and a CT scan.  Will treat symptomatically.      Medical Decision Making    Launch MDCalc MDM Tool   Click the link above to launch the MDCalc MDM tool. Save the tool results and then refresh the note (lower-left corner or [id number]). The MDM tool results must be imported into the note to sign it.    Problems  Clinical Impressions Complexity of problems addressed         Neck pain  Abdominal pain, acute  Cervical stenosis of spinal canal  Hemorrhagic ovarian cyst (Primary) High:  []  Acute/chronic illness/injury with threat to life to bodily function  []  Chronic illness with severe exacerbation, progression, or side effects of treatment    Moderate:  []  Undiagnosed new problem with uncertain prognosis  []  Acute illness with systemic symptoms  []  Acute complicated injury    Data and Risk  Independent Historian []  Parent as child too young to provide hx []  Family/Caregiver due to AMS/dementia []  EMS due to medical acuity/trauma []  Family/EMS due to behavioral health concern and for collateral []    External Data Reviewed previous workup and mgmt of patient's Back Pain via  []  Previous [place] [facility] Notes/Labs/Imaging []  External Notes/Labs/Imaging  which were non-contributory unless documented otherwise in HPI and ED Course   Considered but decided against  []  CT Head/C-spine given [place] CT/NEXUS/PECARN criteria []  CTA Chest to r/o PE given PERC/Well's criteria []  CT AP to r/o appendicitis given PAS score or family discussion []  Hospitalization due to *  []    Discussed w/ ext HCP []  Consults, PCP/outpt specialists, nursing home. See ED Course for details.   SDOH Affecting Dx/Tx []  Insurance limiting specialist referral []  Housing instability limiting outpt mgmt   []  Financial insecurity limiting medication access []  Substance/ETOH use []    Care de-escalation []  Shared decision-making regarding de-escalation of care (e.x. DNR) or foregoing hospitalization-level of care due to patient wishes/goals of care   Interpretations See ED Course. []     If applicable, parenteral controlled substances, drug therapies requiring intensive monitoring for toxicity, and prescription drug management are documented in the the Medications section of this note. If applicable, major and minor procedures are documented separately in Procedure Notes.    Clinical Impression           Neck pain  Abdominal pain, acute  Cervical stenosis of spinal canal  Hemorrhagic ovarian cyst (Primary)  Prescriptions     Discharge Medication List as of 11/08/2023  7:04 AM          Disposition and Follow-up   Disposition:  Discharge [1]     No future appointments.    Follow up with:  Javdan, Sean, MD  [address]  [place] [address]  [phone]    In 3 days        Return precautions are specified on After Visit Summary.                   Christiana Cower., MD  11/08/23 [phone]

## 2023-11-08 NOTE — ED Notes
Dr. Ephriam Knuckles at bedside assessing the patient.

## 2023-11-08 NOTE — ED Notes
 Patient came back from MRI. Airtap placed under the patient for easier transfers.

## 2024-02-18 DIAGNOSIS — R6884 Jaw pain: Principal | ICD-10-CM

## 2024-02-18 NOTE — ED Provider Notes
 [facility]  Emergency Department Service Report    Sarah Whitehead 38 y.o. female , presents with Jaw Pain    Triage   Arrived on 02/18/2024 at 10:12 PM   Arrived by Walk-in [14]    ED Triage Vitals   Temp Temp Source BP Heart Rate Resp SpO2 O2 Device Pain Score Weight   02/18/24 2214 02/18/24 2214 02/18/24 2214 02/18/24 2214 02/18/24 2214 02/18/24 2214 -- 02/18/24 2218 --   37 ?C (98.6 ?F) Oral 132/94 (!) 118 16 97 %  Eight      Pre hospital care:     Allergies   Allergen Reactions    Cloth Tape Other (See Comments) and Rash     ONLY PAPER TAPE IS TOLERATED     Initial Physician Contact     Initial Contact Completed?: Yes (02/18/24 2331)    History   The history is provided by the patient.      Sarah Whitehead is a 38 y.o. female with no pertinent past medical history presenting with 1 week of left sided jaw pain with new onset ear pain and jaw swelling. The patient states that eating is difficult secondary to the pain from the movement of the jaw; she denies difficulty swallowing and is able to tolerate secretions. She also endorses migraine like headaches associated with the pain. She otherwise denies fevers, chills, sore throat, cough, neck pain, back pain, shortness of breath, chest pain, abdominal pain, nausea, vomiting, diarrhea, dysuria, or other acute symptoms.        Past Medical History:   Diagnosis Date    Tailbone injury     breathing tx with medical marijuana        Past Surgical History:   Procedure Laterality Date    APPENDECTOMY      ECTOPIC PREGNANCY SURGERY      removed bilateral fallopian tubes    LIPOSUCTION          Past Family History   family history includes Amyloid in her maternal grandmother and mother; Diabetes in her maternal grandfather; Stomach cancer in her maternal grandfather.           Past Social History   she reports that she has never smoked. She has never used smokeless tobacco. She reports current drug use. Drug: Marijuana. She reports that she does not drink alcohol. No history on file for sexual activity.     Physical Exam   Physical Exam  Constitutional:       General: She is not in acute distress.     Appearance: Normal appearance.   HENT:      Head: Normocephalic and atraumatic.      Jaw: Trismus (Mild) and swelling present.      Comments: Mild left sided jaw and submandibular swelling. Moderately tender to palpation. No pooling of secretions.      Mouth/Throat:      Mouth: Mucous membranes are moist.      Pharynx: Uvula midline.      Comments: Floor of mouth soft  Eyes:      Extraocular Movements: Extraocular movements intact.      Pupils: Pupils are equal, round, and reactive to light.   Cardiovascular:      Rate and Rhythm: Normal rate and regular rhythm.      Pulses: Normal pulses.      Heart sounds: Normal heart sounds.   Pulmonary:      Effort: Pulmonary effort is normal.  Breath sounds: Normal breath sounds.   Abdominal:      General: Abdomen is flat. There is no distension.      Palpations: Abdomen is soft.      Tenderness: There is no abdominal tenderness. There is no guarding.   Musculoskeletal:         General: No deformity. Normal range of motion.      Cervical back: Normal range of motion and neck supple.   Skin:     General: Skin is warm and dry.      Capillary Refill: Capillary refill takes less than 2 seconds.   Neurological:      General: No focal deficit present.      Mental Status: She is alert. Mental status is at baseline.       ED Course     ED Course as of 02/19/24 2230   Mon Feb 18, 2024   2301 ECG 12 lead  Sinus tachycardia based on my review of EKG, no obvious ST changes or T-wave inversions [DA]   Tue Feb 19, 2024   0021 White Blood Cell Count(!): 17.30  Notable leukocytosis [DA]   0248 CT face w contrast  IMPRESSION:     Ill-defined lesion with exophytic appearance on the left while left the hypopharynx. Consider ENT examination. Scattered nonenlarged cervical lymph nodes.    ENT service paged [DA]   [phone] Discussed case with head/neck surgery, they will come to evaluate the patient and perform a scope [DA]   0257 Patient endorses persistent headache at this time, we will administer Tylenol , Reglan  and Decadron  for headache cocktail as well as for left sided jaw swelling [DA]   0258 S/O: 59F p/w 1-2w left sided jaw pain with worsening swelling, +trismus, midline uvula, floor of mouth soft. WBC 17K, CT pharyngeal mass/lesion. Admin'd Tyl/Toradol /Dex/Unasyn , prescribed Augmentin /Ibuprofen . Pending ENT eval/scope and recs [DA]   0406 S/o from Dr Reynolds to Dr Meredeth. 37yoF here with L jaw pain/trismus, leukocytosis, CT with exophytic lesion in hypopharynx,  pending ENT scope in the ER. S/p steroids and abx.    [JC]   9392 Received pt sign out. Pending ENT bedside scope [SR]   [phone] ENT believes it could be severe TMJ, pt reported recent ecstasy use, possible parotitis, recommended warm compresses, NSAIDs, Augmentin , CT likely papilloma off tonsils vs malignancy, recommends follow-up in-network ENT for possible bx/tonsillectomy if persistent sxs. Scope bedside with papilloma.  [JC]      ED Course User Index  [DA] Reynolds Carliss ORN., MD  [JC] Chow, Harlene CROME., MD, MPH  [SR] Licia Doug CROME., MD       Laboratory Results     Labs Reviewed   BASIC METABOLIC PANEL - Abnormal; Notable for the following components:       Result Value    Glucose 120 (*)     All other components within normal limits   CBC (PERFORMABLE) - Abnormal; Notable for the following components:    White Blood Cell Count 17.30 (*)     Mean Corpuscular Volume 99.5 (*)     Red Cell Distribution Width-SD 49.3 (*)     All other components within normal limits   DIFFERENTIAL, AUTOMATED (PERFORMABLE) - Abnormal; Notable for the following components:    Absolute Neut Count 11.69 (*)     Absolute Lymphocyte Count 4.37 (*)     Absolute Mono Count 0.91 (*)     Absolute Immature Gran Count 0.13 (*)     All other components within  normal limits   CBC & AUTO DIFFERENTIAL    Narrative:     The following orders were created for panel order CBC & Auto Differential.  Procedure                               Abnormality         Status                     ---------                               -----------         ------                     RAR[[id number]]                          Abnormal            Final result               Differential, Automated[[id number]]      Abnormal            Final result                 Please view results for these tests on the individual orders.   RAINBOW DRAW TO LABORATORY    Narrative:     The following orders were created for panel order Emelia Sheriff (ED Adult Blood Draw: Nurse Protocol).  Procedure                               Abnormality         Status                     ---------                               -----------         ------                     Extra Sharie Carbon Une[[id number]]                             Final result               Extra Viviane Une[[id number]]                                   Final result               Extra Elnor Cannon                                   Final result                 Please view results for these tests on the individual orders.   EXTRA LIGHT BLUE TOP   EXTRA GOLD TOP   EXTRA GRAY TOP       Imaging Results     CT  face w contrast   Final Result by Seferino Push, MD (08/12 0234)   IMPRESSION:      Ill-defined lesion with exophytic appearance on the left while left the hypopharynx. Consider ENT examination. Scattered nonenlarged cervical lymph nodes.      I, Push Seferino, M.D., have personally reviewed this radiological study and agree with the provided report.      Dictated by: Sari Sprinkles   02/19/2024 1:51 AM      Signed by: PUSH SEFERINO   02/19/2024 2:34 AM          Administered Medications     Medication Administration from 02/18/2024 2212 to 02/19/2024 2230         Date/Time Order Dose Route Action Action by Comments     02/19/2024 0038 PDT ketorolac  30 mg/mL inj 15 mg 15 mg IV Push Given Serafin, Shea, RN --     02/19/2024 0121 PDT iohexol  (Omnipaque ) 350 mg/mL inj 100 mL 100 mL Intravenous Given Landy Lon[address] 29ml/sec     02/19/2024 0250 PDT ampicillin -sulbactam 3 g in sodium chloride 0.9% 100 mL IVPB Vial2Bag 0 g Intravenous Stopped Ngnibogha, Bertin Jr., RN --     02/19/2024 0216 PDT ampicillin -sulbactam 3 g in sodium chloride 0.9% 100 mL IVPB Vial2Bag 3 g Intravenous New Bag/ Syringe/ Cartridge Theador Devonna Raddle., RN --     02/19/2024 0306 PDT acetaminophen  tab 1,000 mg 1,000 mg Oral Given Ngnibogha, Bertin Jr., RN --     02/19/2024 0320 PDT metoclopramide  5 mg/mL inj 5 mg 5 mg IV Push Given Ngnibogha, Bertin Jr., RN --     02/19/2024 0306 PDT dexAMETHasone  PF 10 mg/mL inj 10 mg 10 mg IV Push Given Ngnibogha, Devonna Raddle., RN --                       Procedures   Procedures    Medical Decision Making   Adell Koval is a 38 y.o. female with no pertinent past medical history presenting with 1 week of left sided jaw pain with jaw swelling and pain.  Differential diagnosis includes periapical abscess/infection, lymphadenitis, soft tissue abscess, TMJ, low concern for Ludwig's angina or Lhermitte's.  Plan to obtain baseline labs and imaging of the face to evaluate further.    Labs ordered: CBC and electrolyte panel  (See ED course for review of results)  Imaging ordered:  CT face  (See ED course for independent review of results)    Medical Decision Making    Launch MDCalc MDM Tool   MDCalc MDM Module  Feb 19 2024 2:07 AM [Attikus Bartoszek]  Data:  - Independent interpretation: I independently reviewed the CT Face W contr IV, ECG 12 lead. See MDM section and/or ED Course for my interpretation. [Durwood Dittus]  - Test/documents/historian: 3+ tests ordered  Problems:  odontogenic infection, differential diagnosis including consideration for Ludwig's angina given trismus (high)  Additional encounter diagnoses: Jaw pain, Odontogenic infection of jaw, Periapical periodontitis  Risk: ketorolac  injection + 3 more (Rx drug management), CT Face W contr IV (Iodinated IV contrast in low-risk patient)                  Problems  Clinical Impressions Complexity of problems addressed         Jaw pain (Primary)  Odontogenic infection of jaw  Periapical periodontitis  Acute intractable headache, unspecified headache type  Parotitis  Arthralgia of left temporomandibular joint High:  []  Acute/chronic illness/injury with threat to life  to bodily function  []  Chronic illness with severe exacerbation, progression, or side effects of treatment    Moderate:  []  Undiagnosed new problem with uncertain prognosis  []  Acute illness with systemic symptoms  []  Acute complicated injury    Data and Risk  Independent Historian []  Parent as child too young to provide hx []  Family/Caregiver due to AMS/dementia []  EMS due to medical acuity/trauma []  Family/EMS due to behavioral health concern and for collateral []    External Data Reviewed previous workup and mgmt of patient's Jaw Pain via  []  Previous [facility] Notes/Labs/Imaging []  External Notes/Labs/Imaging  which were non-contributory unless documented otherwise in HPI and ED Course   Considered but decided against  []  CT Head/C-spine given [place] CT/NEXUS/PECARN criteria []  CTA Chest to r/o PE given PERC/Well's criteria []  CT AP to r/o appendicitis given PAS score or family discussion []  Hospitalization due to *  []    Discussed w/ ext HCP []  Consults, PCP/outpt specialists, nursing home. See ED Course for details.   SDOH Affecting Dx/Tx []  Insurance limiting specialist referral []  Housing instability limiting outpt mgmt   []  Financial insecurity limiting medication access []  Substance/ETOH use []    Care de-escalation []  Shared decision-making regarding de-escalation of care (e.x. DNR) or foregoing hospitalization-level of care due to patient wishes/goals of care   Interpretations See ED Course. []     If applicable, parenteral controlled substances, drug therapies requiring intensive monitoring for toxicity, and prescription drug management are documented in the the Medications section of this note. If applicable, major and minor procedures are documented separately in Procedure Notes.    Clinical Impression           Jaw pain (Primary)  Odontogenic infection of jaw  Periapical periodontitis  Acute intractable headache, unspecified headache type  Parotitis  Arthralgia of left temporomandibular joint      Prescriptions     Discharge Medication List as of 02/19/2024  7:15 AM        START taking these medications    Details   amoxicillin -clavulanate 875-125 mg tablet Take 1 tablet by mouth every twelve (12) hours for 7 days., Starting Tue 02/19/2024, Until Tue 02/26/2024, Normal             Disposition and Follow-up   Disposition:  Discharge [1]     No future appointments.    Follow up with:  Javdan, Sean, MD  [address]  [place] [address]  [phone]    Go in 1 day      [facility] Emergency Department  [address]  [place]  [address]  [phone]  Go to   As needed      Return precautions are specified on After Visit Summary.    Scribe Signature   I, Lucie Coop, have acted as a Stage manager for [employer] on behalf of Dr. Reynolds on 02/18/2024 at 10:45 PM. All documentation underwent a comprehensive review by the listed physician(s) and received their approval upon signing.    Carliss ORN. Reynolds, MD  Clinical Faculty, Emergency Medicine & Primary Care Sports Medicine Physician  Departments of Emergency Medicine & Orthopedic Surgery  [facility]       Reynolds Carliss ORN., MD  02/19/24 2230

## 2024-02-19 ENCOUNTER — Ambulatory Visit: Payer: Commercial Managed Care - HMO

## 2024-02-19 ENCOUNTER — Inpatient Hospital Stay
Admit: 2024-02-19 | Discharge: 2024-02-19 | Disposition: A | Discharge status: Home / Self Care | Payer: Commercial Managed Care - HMO | Source: Home / Self Care

## 2024-02-19 DIAGNOSIS — M26622 Arthralgia of left temporomandibular joint: Secondary | ICD-10-CM

## 2024-02-19 DIAGNOSIS — K1120 Sialoadenitis, unspecified: Secondary | ICD-10-CM

## 2024-02-19 DIAGNOSIS — R519 Acute intractable headache, unspecified headache type: Secondary | ICD-10-CM

## 2024-02-19 DIAGNOSIS — K045 Chronic apical periodontitis: Secondary | ICD-10-CM

## 2024-02-19 DIAGNOSIS — M272 Inflammatory conditions of jaws: Secondary | ICD-10-CM

## 2024-02-19 LAB — Extra Gold Top

## 2024-02-19 LAB — Differential Automated: ABSOLUTE IMMATURE GRAN COUNT: 0.13 x10E3/uL — ABNORMAL HIGH (ref 0.00–0.04)

## 2024-02-19 LAB — Basic Metabolic Panel
ANION GAP: 10 mmol/L (ref 8–19)
TOTAL CO2: 22 mmol/L (ref 20–30)

## 2024-02-19 LAB — CBC: WHITE BLOOD CELL COUNT: 17.3 x10E3/uL — ABNORMAL HIGH (ref 4.16–9.95)

## 2024-02-19 LAB — Extra Gray Top

## 2024-02-19 LAB — Extra Light Blue Top

## 2024-02-19 MED ORDER — IBUPROFEN 800 MG PO TABS
800 mg | ORAL_TABLET | Freq: Three times a day (TID) | ORAL | 0 refills | 10.00000 days | Status: AC
Start: 2024-02-19 — End: ?

## 2024-02-19 MED ORDER — AMOXICILLIN-POT CLAVULANATE 875-125 MG PO TABS
1 | ORAL_TABLET | Freq: Two times a day (BID) | ORAL | 0 refills | 7.00000 days | Status: AC
Start: 2024-02-19 — End: ?

## 2024-02-19 MED ORDER — HYDROCODONE-ACETAMINOPHEN 5-325 MG PO TABS
1 | ORAL_TABLET | Freq: Four times a day (QID) | ORAL | 0 refills | 6.50000 days | Status: AC | PRN
Start: 2024-02-19 — End: 2024-02-19

## 2024-02-19 MED ADMIN — METOCLOPRAMIDE HCL 5 MG/ML IJ SOLN: 5 mg | INTRAVENOUS | @ 10:00:00 | Stop: 2024-02-19 | NDC 23155024031

## 2024-02-19 MED ADMIN — AMPICILLIN-SULBACTAM 3 G IN 100 ML NS IVPB V2B: 3 g | INTRAVENOUS | @ 09:00:00 | Stop: 2024-02-19 | NDC 55150011720

## 2024-02-19 MED ADMIN — IOHEXOL 350 MG/ML IV SOLN: 100 mL | INTRAVENOUS | @ 08:00:00 | Stop: 2024-02-19 | NDC 00407141491

## 2024-02-19 MED ADMIN — DEXAMETHASONE SOD PHOSPHATE PF 10 MG/ML IJ SOLN: 10 mg | INTRAVENOUS | @ 10:00:00 | Stop: 2024-02-19 | NDC 70069002101

## 2024-02-19 MED ADMIN — KETOROLAC TROMETHAMINE 30 MG/ML IJ SOLN: 15 mg | INTRAVENOUS | @ 08:00:00 | Stop: 2024-02-19 | NDC 25021070101

## 2024-02-19 MED ADMIN — ACETAMINOPHEN 500 MG PO TABS: 1000 mg | ORAL | @ 10:00:00 | Stop: 2024-02-19 | NDC 00904673061

## 2024-02-19 NOTE — ED Notes
 Pt medically clear of chief complaint, pt vss, pt aaox4, pt ambulatory with steady gait, pt given prescription, pt educated on discharge instructions, pt verbalized understanding, pt leaving ED with all belongings.

## 2024-02-19 NOTE — Consults
 Head and Neck Surgery   Inpatient Consultation Form    PATIENT:  Sarah Whitehead  MRN:  [id number]  DOB:  Oct 17, 1985  DATE OF SERVICE:  02/19/2024    History of Present Illness   Sarah Whitehead is a 38 y.o. female with unremarkable past medical history presenting to [facility] ED for left jaw pain and swelling. Head and Neck Surgery consulted for flexible laryngoscopy exam.     Per patient, was at rave last weekend and reports frequent chewing. The following day on Monday (8/4) began noticing left jaw pain. Pain has progressively worsened this week with associated facial swelling, trismus, and otalgia on the left. Says pain was 7/10 yesterday which prompted eval. Otherwise has noticed L>R mild facial swelling. No sore throat, infectious symptoms, dysphagia, odynophagia, dyspnea, dysphonia. AFVSS, WBC 17.3     Medical & Surgical History   Past Medical History:  Past Medical History:   Diagnosis Date    Tailbone injury     breathing tx with medical marijuana        Past Surgical History:  Past Surgical History:   Procedure Laterality Date    APPENDECTOMY      ECTOPIC PREGNANCY SURGERY      removed bilateral fallopian tubes    LIPOSUCTION          Social History:   reports that she has never smoked. She has never used smokeless tobacco. She reports current drug use. Drug: Marijuana. She reports that she does not drink alcohol.    Family History:  Non-contributory.    Medications & Allergies   Medications:  Scheduled Medications:      PRN Medications:       Allergies:   Allergies   Allergen Reactions    Cloth Tape Other (See Comments) and Rash     ONLY PAPER TAPE IS TOLERATED        Review of Systems   Review of Systems:   Pertinent factors have been included in the HPI, and the review of systems is otherwise negative.     Physical Exam   Vitals signs for the last 24 hours:  Temp:  [37 ?C (98.6 ?F)] 37 ?C (98.6 ?F)  Heart Rate:  [83-118] 83  Resp:  [8-18] 14  BP: (101-132)/(62-94) 112/79  NBP Mean:  [72-107] 90  SpO2:  [93 %-97 %] 93 %    NAD  No increased WOB  Pulses regular  Skin warm well perfused    EOMI  2 FB trismus. Oral cavity MMM, no lesions, FOM soft with nonspecific pain on the left FOM.  Unable to assess BOT due to trismus.   Pain is palpated over the TMJ externally and when pushed on it above the RMT intraorally as well.   L>R asymmetric tonsillar enlargement and left small exophytic lesion coming off the inferior aspect of the left tonsil, appears papillomatous   No TTP of left tonsil, tonsil feels soft  Has minimal L>R jaw/facial swelling   V1-3 intact bilaterally  HB 1/6 bilaterally  Neck soft, no LAD or tenderness to palpation of bilateral neck  Trachea midline   Ears: TMi no MEE, EAC wnl AU. No TTP on any aspects of the exam     02/19/2024 Flexible Endoscopy:  Nasal cavities clear, nasopharynx clear, oropharynx with L>R oropharyngeal fullness and left exophytic papillomatous lesion appearing from left inferior tonsil, hypopharynx clear, vocal folds symmetric and mobile bilaterally, airway patent. No other masses appreciated    Labs   Detailed I&O:  No intake/output data recorded.  No intake or output data in the 24 hours ending 02/19/24 0610      Lab Review:    WBC/Hgb/Hct/Plts:  17.30/13.1/40.6/393 (08/11 2338)   Na/K/Cl/CO2/BUN/Cr/glu:  136/3.9/104/22/12/0.69/120 (08/11 2338)             Imaging   CT Face with con - 02/19/24  Exophytic lesion coming off the inferior aspect of the left tonsil     Assessment & Recommendations   Sarah Whitehead is a 38 y.o. female presenting with left jaw pain, swelling, trismus, otalgia for 1 week. Overall, pain appears most consistent with severe TMJ on left side given reliable hx and consistent exam. Other etiologies of pain could include salivary gland process such as parotitis, has risk factor for dehydration day prior to sx onset and has leukocytosis. Other etiologies on ddx include malignancy; although lesion seen on CT appears to look more papillomatous and symptoms have only been present for 1 week; should monitor sx for resolution and follow-up for possible re-assessment if not improved. Possibly other infectious etiology but has no other sx, no sore throat or e/o infection on scope; unclear why there is leukocytosis; recommend assessment for other possible etiologies.        We recommend the following:  > No acute Head and Neck Surgery interventions at this time. No airway concerns on scope exam  > Left TMJ care: Warm compresses, massage regularly, NSAIDs (if not otherwise contraindicated), mouth guard, soft foods.   > Left tonsillar lesion: appears papillomatous. Patient should follow-up outpatient with in-network ENT for reevaluation and discussion about biopsy or tonsillectomy. If pain symptoms do not improve in 1 week should consider more urgent evaluation to ensure not underlying tonsillar process    > Recommend course of Augmentin  for possible parotitis or other infectious etiology.   > Recommend proper hydration    > Strict return precautions if pain not improving or for any new or worsening symptoms     Please page the head and neck surgery resident on call at [phone] (RR) or [phone] [facility]) for any questions.  The above was discussed with the attending physician, Dr. Orval, who is in agreement.  Author:    Patterson I. Rojean, MD    02/19/2024 6:10 AM  [facility]

## 2024-02-19 NOTE — Discharge Instructions
 Emergency Department Discharge Instructions      Summary of your visit  You have been evaluated in the [facility] Emergency Department today for jaw swelling and pain.  Your evaluation included a physical exam, blood work, and CT scan which revealed a likely infection of 1 of your teeth.  Please establish care with a dentist within the next 2 days for evaluation.  Please follow-up with your in-network ENT (ask for a referral from your regular doctor) if your symptoms persist despite finishing the course of antibiotics, for a possible repeat CT scan, and possible biopsy or re-evaluation with a scope.     Medications  Please take prescribed antibiotics  You have been prescribed a stronger pain medication, old Norco, which includes acetaminophen  (same as Tylenol ) and hydrocodone  which is an opioid medication.  Please use this for severe incident as the pain.  You should also consider taking anti-inflammatory medications to help your symptoms .    Follow-Up  Please follow up with your primary care physician within three days after being discharged from the ER - you can call to schedule an appointment.  You can find a primary care physician at [facility] by calling [phone].    If you do not have a Primary Care Physician, please call your insurance company or you may call [phone] to establish care with a [facility] physician.  If you are uninsured, please call 2-1-1 to find a free or low-cost clinic in your area. 2-1-1 [place] is the central source for providing information and referrals for all health and human services in [facility]. Our 2-1-1 phone line is open 24 hours, 7 days a week, with trained [employer] prepared to offer help with any situation, any time. Our community services go far beyond phone referrals - explore our website to learn more. If you are calling from outside [facility] or cannot directly dial 2-1-1, you can call [phone].      Return to the Emergency Department if you experience:  Fevers 100.4 ?F or greater  Worsening or uncontrolled pain  Persistent nausea and vomiting  Hearing loss  Discharge, drainage, or bleeding from your ear  Headaches or dizziness  Any other concerning symptoms     Thank you for choosing [facility] for your care. It was a pleasure taking part in your care today, and we wish you the best!

## 2024-05-02 ENCOUNTER — Ambulatory Visit: Payer: Commercial Managed Care - HMO

## 2024-05-02 ENCOUNTER — Inpatient Hospital Stay
Admission: EM | Admit: 2024-05-02 | Discharge: 2024-05-02 | Disposition: A | Discharge status: Home / Self Care | Payer: Commercial Managed Care - HMO | Source: Home / Self Care

## 2024-05-02 DIAGNOSIS — R052 Subacute cough: Secondary | ICD-10-CM

## 2024-05-02 DIAGNOSIS — J020 Streptococcal pharyngitis: Secondary | ICD-10-CM

## 2024-05-02 DIAGNOSIS — R509 Fever, unspecified: Secondary | ICD-10-CM

## 2024-05-02 LAB — Infectious Mono Ab: INFECTIOUS MONONUCLEOSIS AB: NONREACTIVE

## 2024-05-02 LAB — CBC: WHITE BLOOD CELL COUNT: 16.27 x10E3/uL — ABNORMAL HIGH (ref 4.16–9.95)

## 2024-05-02 LAB — Expedited COVID-19 and Influenza A B PCR: INFLUENZA B PCR: NOT DETECTED

## 2024-05-02 LAB — Group A Streptococcus PCR: GROUP A STREPTOCOCCUS PCR: DETECTED — AB

## 2024-05-02 LAB — Basic Metabolic Panel: CREATININE: 0.63 mg/dL (ref 0.60–1.30)

## 2024-05-02 LAB — UA,Microscopic: SQUAMOUS EPITHELIAL CELLS: 4 {cells}/uL (ref 0–17)

## 2024-05-02 LAB — UA,Dipstick: SPECIFIC GRAVITY: 1.009 (ref 1.005–1.030)

## 2024-05-02 LAB — Pregnancy Test,Blood: PREGNANCY TEST,BLOOD: NEGATIVE

## 2024-05-02 MED ORDER — BENZONATATE 100 MG PO CAPS
100 mg | ORAL_CAPSULE | Freq: Three times a day (TID) | ORAL | 0 refills | 10.00000 days | Status: AC
Start: 2024-05-02 — End: ?

## 2024-05-02 MED ORDER — ALBUTEROL SULFATE HFA 108 (90 BASE) MCG/ACT IN AERS
2 | RESPIRATORY_TRACT | 0 refills | 27.50000 days | Status: AC | PRN
Start: 2024-05-02 — End: ?

## 2024-05-02 MED ORDER — AMOXICILLIN-POT CLAVULANATE 875-125 MG PO TABS
1 | ORAL_TABLET | Freq: Two times a day (BID) | ORAL | 0 refills | 7.00000 days | Status: AC
Start: 2024-05-02 — End: ?

## 2024-05-02 MED ADMIN — SODIUM CHLORIDE 0.9 % IV BOLUS: 1000 mL | INTRAVENOUS | @ 11:00:00 | Stop: 2024-05-02 | NDC 00338004904

## 2024-05-02 MED ADMIN — AMPICILLIN-SULBACTAM 3 G IN 100 ML NS IVPB V2B: 3 g | INTRAVENOUS | @ 13:00:00 | Stop: 2024-05-02 | NDC 72485041701

## 2024-05-02 MED ADMIN — BENZONATATE 100 MG PO CAPS: 200 mg | ORAL | @ 11:00:00 | Stop: 2024-05-02 | NDC 68084021411

## 2024-05-02 MED ADMIN — ALBUTEROL SULFATE (5 MG/ML) IN NEBU 0.5 ML VIAL: 5 mg | RESPIRATORY_TRACT | @ 11:00:00 | Stop: 2024-05-02 | NDC 00487990130

## 2024-05-02 MED ADMIN — ACETAMINOPHEN 500 MG PO TABS: 1000 mg | ORAL | @ 11:00:00 | Stop: 2024-05-02 | NDC 00904673061

## 2024-05-02 MED ADMIN — KETOROLAC TROMETHAMINE 30 MG/ML IJ SOLN: 30 mg | INTRAVENOUS | @ 11:00:00 | Stop: 2024-05-02 | NDC 25021070101

## 2024-05-02 NOTE — ED Provider Notes
 [facility]  Emergency Department Service Report    Sarah Whitehead 38 y.o. female , presents with ILI/PUI    Triage   Arrived on 05/01/2024 at 11:54 PM   Arrived by Car [5]    ED Triage Vitals   Temp Temp Source BP Heart Rate Resp SpO2 O2 Device Pain Score Weight   05/01/24 2359 05/01/24 2359 05/01/24 2359 05/01/24 2359 05/01/24 2359 05/01/24 2359 -- 05/02/24 0008 05/02/24 0009   37.6 ?C (99.7 ?F) Oral 120/65 (!) 117 18 94 %  Seven 81.6 kg (180 lb)       Pre hospital care:       Allergies   Allergen Reactions    Cloth Tape Other (See Comments) and Rash     ONLY PAPER TAPE IS TOLERATED         Initial Physician Contact       Initial Contact Completed?: Yes (05/02/24 0054)      History   HPI   Sarah Whitehead is a 38 y.o. female, with no pertinent medical history, who presents to the ED with a cough onset five days ago. Patient states that she initially believed her symptoms including fatigue, ear pain, jaw and throat pain, cough, and fever to be due to a cold but became concerned after they worsened. She states that she has tried to rest but intermittently woke up covered in sweat. Patient states that her cough has been productive and describes the phlegm as brown, yellow, and occasionally red in color. She further reports that her sore throat is worsened by her cough and has some shortness of breath after she coughs. Patient states that she has not tested for COVID yet but has lost her smell over the past few days and that she has had it in the past. Patient states that she became concerned after coughing and ''choking'' on her congestion which prompted her to present to the ED tonight so that she would not be alone at home dealing with her symptoms. She denies any trauma, abdominal pain, nausea, vomiting, diarrhea.           Past Medical History:   Diagnosis Date    Tailbone injury     breathing tx with medical marijuana        Past Surgical History:   Procedure Laterality Date    APPENDECTOMY ECTOPIC PREGNANCY SURGERY      removed bilateral fallopian tubes    LIPOSUCTION          Past Family History   family history includes Amyloid in her maternal grandmother and mother; Diabetes in her maternal grandfather; Stomach cancer in her maternal grandfather.                 Past Social History   she reports that she has never smoked. She has never used smokeless tobacco. She reports current drug use. Drug: Marijuana. She reports that she does not drink alcohol. No history on file for sexual activity.       Physical Exam   Physical Exam  Vitals and nursing note reviewed.   Constitutional:       Appearance: Normal appearance.   HENT:      Head: Normocephalic and atraumatic.      Right Ear: Tympanic membrane normal.      Ears:      Comments: (+) Left tympanic membrane bulging but no fluid behind.     Nose: Congestion present.      Mouth/Throat:  Mouth: Mucous membranes are moist.      Pharynx: Oropharynx is clear. Posterior oropharyngeal erythema present. No oropharyngeal exudate.   Eyes:      Extraocular Movements: Extraocular movements intact.      Conjunctiva/sclera: Conjunctivae normal.      Pupils: Pupils are equal, round, and reactive to light.   Cardiovascular:      Rate and Rhythm: Normal rate and regular rhythm.      Pulses: Normal pulses.      Heart sounds: Normal heart sounds.   Pulmonary:      Effort: Pulmonary effort is normal. No respiratory distress.      Breath sounds: Wheezing present.      Comments: (+) End expiratory wheezes bilaterally   Abdominal:      General: Abdomen is flat. Bowel sounds are normal.      Palpations: Abdomen is soft.      Tenderness: There is no abdominal tenderness.   Musculoskeletal:         General: Normal range of motion.      Cervical back: Normal range of motion.      Right lower leg: No edema.      Left lower leg: No edema.   Skin:     General: Skin is warm and dry.      Capillary Refill: Capillary refill takes less than 2 seconds.   Neurological:      General: No focal deficit present.      Mental Status: She is alert and oriented to person, place, and time.   Psychiatric:         Mood and Affect: Mood normal.         Behavior: Behavior normal.         Laboratory Results     Labs Reviewed   GROUP A STREPTOCOCCUS PCR ([facility] ONLY) - Abnormal; Notable for the following components:       Result Value    Group A Streptococcus PCR Detected (*)     All other components within normal limits   BASIC METABOLIC PANEL - Abnormal; Notable for the following components:    Urea Nitrogen 6 (*)     All other components within normal limits   CBC - Abnormal; Notable for the following components:    White Blood Cell Count 16.27 (*)     All other components within normal limits   UA,DIPSTICK - Abnormal; Notable for the following components:    Blood 1+ (*)     All other components within normal limits   INFECTIOUS MONONUCLEOSIS AB - Normal    Narrative:     95% reactive in proven infectious mononucleosis; 86% reactive by the first week of illness. Since younger children do not develop the full-blown syndrome in primary infection, they may not produce heterophile antibody.  Request EBV Panel.   PREGNANCY TEST,BLOOD - Normal   UA,MICROSCOPIC - Normal   EXPEDITED COVID-19 AND INFLUENZA A B PCR, RESPIRATORY UPPER   CHLAMYDIA TRACHOMATIS/NEISSERIA GONORRHOEAE PCR, URINE   URINALYSIS W/REFLEX TO CULTURE    Narrative:     The following orders were created for panel order Urinalysis w/Reflex to Culture.  Procedure                               Abnormality         Status                     ---------                               -----------         ------  UA,Dipstick[[id number]]                  Abnormal            Final result               UA,Microscopic[[id number]]               Normal              Final result                 Please view results for these tests on the individual orders.   RPR   HIV-1 QUANTITATION PCR       Imaging Results     XR chest ap portable (1 view)   Final Result by Arlys Hilding, MD (10/24 0130)   IMPRESSION:        Normal cardiomediastinal silhouette.   Normal lung volumes. No consolidation.   No pleural effusions. No pneumothorax.   No acute osseous abnormalities.            I, Hilding Arlys, M.D., have reviewed this radiological study personally and I am in full agreement with the findings of the report presented here.      Dictated by: Bernice Devonshire   05/02/2024 12:50 AM      Signed by: Hilding Arlys   05/02/2024 1:30 AM          Administered Medications     Medication Administration from 05/01/2024 2354 to 05/02/2024 0550         Date/Time Order Dose Route Action Action by Comments     05/02/2024 0346 PDT acetaminophen  tab 1,000 mg 1,000 mg Oral Given Dasie Rover, RN --     05/02/2024 0538 PDT sodium chloride 0.9% IV soln bolus 1,000 mL 0 mL Intravenous Parthenia Dasie, Rover, RN --     05/02/2024 0358 PDT sodium chloride 0.9% IV soln bolus 1,000 mL 1,000 mL Intravenous New Bag/ Syringe/ Cartridge Dasie Rover, RN --     05/02/2024 0352 PDT ketorolac  30 mg/mL inj 30 mg 30 mg IV Push Given Dasie Rover, RN --     05/02/2024 0347 PDT benzonatate cap 200 mg 200 mg Oral Given Dasie Rover, RN --     05/02/2024 0402 PDT albuterol (2.5 mg/0.5 mL) 0.5% 5 mg 5 mg Nebulization Given Tov, Seyla, RCP --     05/02/2024 0536 PDT ampicillin -sulbactam 3 g in sodium chloride 0.9% 100 mL IVPB Vial2Bag 3 g Intravenous New Bag/ Syringe/ Cartridge Dasie Rover, RN --                       Procedures   Procedures    Medical Decision Making   Sarah Whitehead is a 38 y.o. female  with no pertinent medical history, who presents to the ED with a cough onset five days ago. Patient with cough. Differential diagnosis includes viral infection versus bronchitis versus reactive airway disease versus pneumonia. Plan for viral panel, chest x-ray, nebulizer treatment and symptom control.       Medical Decision Making    Problems  Clinical Impressions Complexity of problems addressed         Strep pharyngitis (Primary)  Subacute cough  Fever, unspecified fever cause High:  []  Acute/chronic illness/injury with threat to life to bodily function  []  Chronic illness with severe exacerbation, progression, or side effects of treatment    Moderate:  [x]  Undiagnosed new problem with uncertain prognosis  [x]  Acute illness  with systemic symptoms  []  Acute complicated injury    Data and Risk  Independent Historian []  Parent as child too young to provide hx []  Family/Caregiver due to AMS/dementia []  EMS due to medical acuity/trauma []  Family/EMS due to behavioral health concern and for collateral []    External Data Reviewed previous workup and mgmt of patient's ILI/PUI via  [x]  Previous [facility] Notes/Labs/Imaging []  External Notes/Labs/Imaging  which were non-contributory unless documented otherwise in HPI and ED Course   Considered but decided against  []  CT Head/C-spine given [place] CT/NEXUS/PECARN criteria []  CTA Chest to r/o PE given PERC/Well's criteria []  CT AP to r/o appendicitis given PAS score or family discussion []  Hospitalization due to *  []    Discussed w/ ext HCP []  Consults, PCP/outpt specialists, nursing home. See ED Course for details.   SDOH Affecting Dx/Tx []  Insurance limiting specialist referral []  Housing instability limiting outpt mgmt   []  Financial insecurity limiting medication access []  Substance/ETOH use []    Care de-escalation []  Shared decision-making regarding de-escalation of care (e.x. DNR) or foregoing hospitalization-level of care due to patient wishes/goals of care   Interpretations See ED Course. [x]  Independent interpretations of ECG or Radiology: See ED Course   If applicable, parenteral controlled substances, drug therapies requiring intensive monitoring for toxicity, and prescription drug management are documented in the the Medications section of this note. If applicable, major and minor procedures are documented separately in Procedure Notes.    ED Course     ED Course as of 05/02/24 0550   Fri May 02, 2024   0051 CXR without lobar consolidation or other acute process, my interpretation.  [SK]   0439 COVID-19 PCR/TMA: Not Detected [SK]   0439 Influenza A, PCR: Not Detected [SK]   0439 White Blood Cell Count(!): 16.27 [SK]   0442 Group A Streptococcus PCR(!): Detected  Abx ordered [SK]   0451 Infectious Mono Ab: Nonreactive [SK]   0509 Feels improved w/ meds. Discussed positive strep and plan for abx.  [SK]   [id number] Urinalysis without evidence of infection or stone. [SK]      ED Course User Index  [SK] Rolfe Lucie SQUIBB., MD, MPH         Clinical Impression           Strep pharyngitis (Primary)  Subacute cough  Fever, unspecified fever cause      Prescriptions     New Prescriptions    ALBUTEROL 90 MCG/ACT INHALER    Inhale 2 puffs every four (4) hours as needed for Wheezing or Shortness of Breath.    AMOXICILLIN -CLAVULANATE 875-125 MG TABLET    Take 1 tablet by mouth every twelve (12) hours for 7 days.    BENZONATATE 100 MG CAPSULE    Take 1 capsule (100 mg total) by mouth every eight (8) hours.       Disposition and Follow-up   Disposition:  Discharge [1] 05/02/2024  5:39 AM    No future appointments.    Follow up with:  Center[facility]  [address]  [place] [address]  [phone]    Call   For ENT referral    [facility]  [address]  [place]  [address]  [phone]  Schedule an appointment as soon as possible for a visit   As needed      Return precautions are specified on After Visit Summary.      Scribe Signature   I,  Charlie Horns, have acted as a stage manager for [employer] on behalf of Dr. Rolfe on 05/02/2024 at 12:07 AM. All documentation underwent a comprehensive review by the listed physician(s) and received their approval upon signing.    Physician Signature(s)         I have reviewed this note as recorded by [facility], who acted as medical scribe, and I attest that it is an accurate representation of my H&P and other events of the ED visit except as otherwise noted.            Rolfe Lucie SQUIBB., MD, MPH  05/02/24 [phone]

## 2024-05-02 NOTE — Discharge Instructions
 Emergency Department Discharge Instructions      Summary of your visit  You have been evaluated in the [facility] Emergency Department today for fever.  Your evaluation included a physical exam, xray, labs and urine studies.  You were found to have strep pharyngitis and had been given antibiotics for this.  Some of the additional testing your requested has not resulted at this time and he will be called if there are any abnormalities.  We have observed you in the ER and have determined that you are stable for discharge at this time.    Follow-Up  Please follow up with your primary care physician within three days after being discharged from the ER - you can call to schedule an appointment.  You can find a primary care physician at [facility] by calling [phone].    If you do not have a Primary Care Physician, please call your insurance company or you may call [phone] to establish care with a [facility] physician.  If you are uninsured, please call 2-1-1 to find a free or low-cost clinic in your area. 2-1-1 [place] is the central source for providing information and referrals for all health and human services in [facility]. Our 2-1-1 phone line is open 24 hours, 7 days a week, with trained [employer] prepared to offer help with any situation, any time. Our community services go far beyond phone referrals - explore our website to learn more. If you are calling from outside [facility] or cannot directly dial 2-1-1, you can call [phone].      Return to the Emergency Department if you experience:  Fevers 100.4 ?F or greater  Worsening or uncontrolled pain  Persistent nausea and vomiting  Chest pain  Shortness of breath  Numbness or weakness  Fainting  Any other concerning symptoms     Thank you for choosing [facility] for your care. It was a pleasure taking part in your care today, and we wish you the best!

## 2024-05-03 LAB — RPR: RPR: NONREACTIVE

## 2024-05-05 LAB — HIV RNA Quantitative PCR: HIV RNA PCR: NOT DETECTED

## 2024-05-06 LAB — Chlamydia trachomatis/Neisseria gonorrhoeae PCR
